# Patient Record
Sex: Male | Born: 1935 | Race: White | Hispanic: No | Smoking: Former smoker
Health system: Southern US, Community
[De-identification: ages and names within clinical notes are randomized; demographics above are authoritative.]

## PROBLEM LIST (undated history)

## (undated) DIAGNOSIS — R079 Chest pain, unspecified: Secondary | ICD-10-CM

## (undated) DIAGNOSIS — Z9889 Other specified postprocedural states: Secondary | ICD-10-CM

## (undated) DIAGNOSIS — I714 Abdominal aortic aneurysm, without rupture: Secondary | ICD-10-CM

## (undated) DIAGNOSIS — I491 Atrial premature depolarization: Secondary | ICD-10-CM

## (undated) DIAGNOSIS — K760 Fatty (change of) liver, not elsewhere classified: Secondary | ICD-10-CM

## (undated) DIAGNOSIS — I493 Ventricular premature depolarization: Secondary | ICD-10-CM

## (undated) DIAGNOSIS — I6529 Occlusion and stenosis of unspecified carotid artery: Secondary | ICD-10-CM

## (undated) DIAGNOSIS — Z87898 Personal history of other specified conditions: Secondary | ICD-10-CM

## (undated) DIAGNOSIS — E785 Hyperlipidemia, unspecified: Secondary | ICD-10-CM

## (undated) DIAGNOSIS — Z8673 Personal history of transient ischemic attack (TIA), and cerebral infarction without residual deficits: Secondary | ICD-10-CM

## (undated) DIAGNOSIS — C61 Malignant neoplasm of prostate: Secondary | ICD-10-CM

## (undated) DIAGNOSIS — I739 Peripheral vascular disease, unspecified: Secondary | ICD-10-CM

## (undated) DIAGNOSIS — I10 Essential (primary) hypertension: Secondary | ICD-10-CM

## (undated) DIAGNOSIS — K219 Gastro-esophageal reflux disease without esophagitis: Secondary | ICD-10-CM

## (undated) DIAGNOSIS — Z9289 Personal history of other medical treatment: Secondary | ICD-10-CM

## (undated) HISTORY — DX: Occlusion and stenosis of unspecified carotid artery: I65.29

## (undated) HISTORY — DX: Malignant neoplasm of prostate: C61

## (undated) HISTORY — PX: APPENDECTOMY: SHX54

## (undated) HISTORY — DX: Personal history of other specified conditions: Z87.898

## (undated) HISTORY — DX: Essential (primary) hypertension: I10

## (undated) HISTORY — PX: TONSILLECTOMY: SHX5217

## (undated) HISTORY — DX: Abdominal aortic aneurysm, without rupture: I71.4

## (undated) HISTORY — DX: Gastro-esophageal reflux disease without esophagitis: K21.9

## (undated) HISTORY — PX: HERNIA REPAIR: SHX51

## (undated) HISTORY — DX: Personal history of transient ischemic attack (TIA), and cerebral infarction without residual deficits: Z86.73

## (undated) HISTORY — PX: TOTAL KNEE ARTHROPLASTY: SHX125

## (undated) HISTORY — PX: PROSTATE SURGERY: SHX751

## (undated) HISTORY — DX: Hyperlipidemia, unspecified: E78.5

## (undated) HISTORY — DX: Ventricular premature depolarization: I49.3

## (undated) HISTORY — DX: Atrial premature depolarization: I49.1

## (undated) HISTORY — DX: Other specified postprocedural states: Z98.890

---

## 1996-04-13 HISTORY — PX: CAROTID ENDARTERECTOMY: SUR193

## 1997-12-18 ENCOUNTER — Ambulatory Visit (HOSPITAL_COMMUNITY): Admission: RE | Admit: 1997-12-18 | Discharge: 1997-12-18 | Payer: Self-pay | Admitting: Gastroenterology

## 1999-05-21 ENCOUNTER — Encounter: Payer: Self-pay | Admitting: General Surgery

## 1999-05-23 ENCOUNTER — Ambulatory Visit (HOSPITAL_COMMUNITY): Admission: RE | Admit: 1999-05-23 | Discharge: 1999-05-24 | Payer: Self-pay | Admitting: General Surgery

## 1999-05-23 ENCOUNTER — Encounter (INDEPENDENT_AMBULATORY_CARE_PROVIDER_SITE_OTHER): Payer: Self-pay | Admitting: Specialist

## 1999-08-04 ENCOUNTER — Ambulatory Visit (HOSPITAL_COMMUNITY): Admission: RE | Admit: 1999-08-04 | Discharge: 1999-08-04 | Payer: Self-pay | Admitting: Gastroenterology

## 2000-02-04 ENCOUNTER — Encounter: Payer: Self-pay | Admitting: Gastroenterology

## 2000-02-04 ENCOUNTER — Encounter: Admission: RE | Admit: 2000-02-04 | Discharge: 2000-02-04 | Payer: Self-pay | Admitting: Gastroenterology

## 2002-02-10 ENCOUNTER — Encounter: Payer: Self-pay | Admitting: Family Medicine

## 2002-02-10 ENCOUNTER — Ambulatory Visit (HOSPITAL_COMMUNITY): Admission: RE | Admit: 2002-02-10 | Discharge: 2002-02-10 | Payer: Self-pay | Admitting: *Deleted

## 2002-02-15 ENCOUNTER — Encounter: Admission: RE | Admit: 2002-02-15 | Discharge: 2002-02-15 | Payer: Self-pay | Admitting: Vascular Surgery

## 2002-02-15 ENCOUNTER — Encounter: Payer: Self-pay | Admitting: Vascular Surgery

## 2003-01-10 ENCOUNTER — Encounter: Payer: Self-pay | Admitting: Gastroenterology

## 2003-01-10 ENCOUNTER — Ambulatory Visit (HOSPITAL_COMMUNITY): Admission: RE | Admit: 2003-01-10 | Discharge: 2003-01-10 | Payer: Self-pay | Admitting: Gastroenterology

## 2003-05-25 ENCOUNTER — Encounter: Admission: RE | Admit: 2003-05-25 | Discharge: 2003-05-25 | Payer: Self-pay | Admitting: Orthopedic Surgery

## 2003-06-08 ENCOUNTER — Encounter: Admission: RE | Admit: 2003-06-08 | Discharge: 2003-06-08 | Payer: Self-pay | Admitting: Orthopedic Surgery

## 2003-06-11 ENCOUNTER — Ambulatory Visit (HOSPITAL_BASED_OUTPATIENT_CLINIC_OR_DEPARTMENT_OTHER): Admission: RE | Admit: 2003-06-11 | Discharge: 2003-06-11 | Payer: Self-pay | Admitting: Orthopedic Surgery

## 2003-06-11 ENCOUNTER — Ambulatory Visit (HOSPITAL_COMMUNITY): Admission: RE | Admit: 2003-06-11 | Discharge: 2003-06-11 | Payer: Self-pay | Admitting: Orthopedic Surgery

## 2004-10-21 ENCOUNTER — Ambulatory Visit (HOSPITAL_COMMUNITY): Admission: RE | Admit: 2004-10-21 | Discharge: 2004-10-21 | Payer: Self-pay | Admitting: Gastroenterology

## 2007-01-05 ENCOUNTER — Ambulatory Visit (HOSPITAL_COMMUNITY): Admission: RE | Admit: 2007-01-05 | Discharge: 2007-01-05 | Payer: Self-pay | Admitting: Vascular Surgery

## 2007-02-04 ENCOUNTER — Ambulatory Visit: Payer: Self-pay | Admitting: Vascular Surgery

## 2008-02-08 ENCOUNTER — Ambulatory Visit (HOSPITAL_COMMUNITY): Admission: RE | Admit: 2008-02-08 | Discharge: 2008-02-08 | Payer: Self-pay | Admitting: Gastroenterology

## 2008-03-01 ENCOUNTER — Encounter: Admission: RE | Admit: 2008-03-01 | Discharge: 2008-03-01 | Payer: Self-pay | Admitting: Gastroenterology

## 2008-03-16 ENCOUNTER — Ambulatory Visit: Payer: Self-pay | Admitting: Vascular Surgery

## 2008-07-11 ENCOUNTER — Ambulatory Visit (HOSPITAL_COMMUNITY): Admission: RE | Admit: 2008-07-11 | Discharge: 2008-07-11 | Payer: Self-pay | Admitting: Orthopedic Surgery

## 2009-03-04 ENCOUNTER — Ambulatory Visit: Payer: Self-pay | Admitting: Vascular Surgery

## 2009-06-19 ENCOUNTER — Ambulatory Visit (HOSPITAL_COMMUNITY): Admission: RE | Admit: 2009-06-19 | Discharge: 2009-06-19 | Payer: Self-pay | Admitting: Orthopedic Surgery

## 2009-07-09 ENCOUNTER — Inpatient Hospital Stay (HOSPITAL_COMMUNITY): Admission: RE | Admit: 2009-07-09 | Discharge: 2009-07-13 | Payer: Self-pay | Admitting: Orthopedic Surgery

## 2010-01-13 ENCOUNTER — Ambulatory Visit: Payer: Self-pay | Admitting: Cardiology

## 2010-03-18 ENCOUNTER — Ambulatory Visit: Payer: Self-pay | Admitting: Vascular Surgery

## 2010-07-02 LAB — HEMOGLOBIN AND HEMATOCRIT, BLOOD: Hemoglobin: 10.8 g/dL — ABNORMAL LOW (ref 13.0–17.0)

## 2010-07-02 LAB — PROTIME-INR: Prothrombin Time: 22.2 seconds — ABNORMAL HIGH (ref 11.6–15.2)

## 2010-07-07 LAB — DIFFERENTIAL
Basophils Absolute: 0 10*3/uL (ref 0.0–0.1)
Basophils Relative: 0 % (ref 0–1)
Eosinophils Absolute: 0.1 10*3/uL (ref 0.0–0.7)
Eosinophils Relative: 1 % (ref 0–5)

## 2010-07-07 LAB — COMPREHENSIVE METABOLIC PANEL
ALT: 21 U/L (ref 0–53)
AST: 25 U/L (ref 0–37)
CO2: 31 mEq/L (ref 19–32)
Chloride: 102 mEq/L (ref 96–112)
Creatinine, Ser: 0.89 mg/dL (ref 0.4–1.5)
GFR calc Af Amer: >60 mL/min (ref >60)
GFR calc non Af Amer: >60 mL/min (ref >60)
Total Bilirubin: 0.7 mg/dL (ref 0.3–1.2)

## 2010-07-07 LAB — PROTIME-INR
Prothrombin Time: 13.2 seconds (ref 11.6–15.2)
Prothrombin Time: 14.2 seconds (ref 11.6–15.2)

## 2010-07-07 LAB — CBC
MCV: 90.7 fL (ref 78.0–100.0)
RBC: 4.84 MIL/uL (ref 4.22–5.81)
WBC: 9 10*3/uL (ref 4.0–10.5)

## 2010-07-07 LAB — URINALYSIS, ROUTINE W REFLEX MICROSCOPIC
Bilirubin Urine: NEGATIVE
Glucose, UA: NEGATIVE mg/dL
Hgb urine dipstick: NEGATIVE
Hgb urine dipstick: NEGATIVE
Protein, ur: NEGATIVE mg/dL
Specific Gravity, Urine: 1.021 (ref 1.005–1.030)
Urobilinogen, UA: 0.2 mg/dL (ref 0.0–1.0)
Urobilinogen, UA: 0.2 mg/dL (ref 0.0–1.0)
pH: 6.5 (ref 5.0–8.0)

## 2010-07-07 LAB — HEMOGLOBIN AND HEMATOCRIT, BLOOD
HCT: 33.3 % — ABNORMAL LOW (ref 39.0–52.0)
HCT: 33.8 % — ABNORMAL LOW (ref 39.0–52.0)
HCT: 43.8 % (ref 39.0–52.0)
Hemoglobin: 14.8 g/dL (ref 13.0–17.0)

## 2010-07-24 LAB — COMPREHENSIVE METABOLIC PANEL
AST: 25 U/L (ref 0–37)
Alkaline Phosphatase: 99 U/L (ref 39–117)
CO2: 30 mEq/L (ref 19–32)
Chloride: 104 mEq/L (ref 96–112)
Creatinine, Ser: 0.91 mg/dL (ref 0.4–1.5)
GFR calc Af Amer: >60 mL/min (ref >60)
GFR calc non Af Amer: >60 mL/min (ref >60)
Potassium: 4.4 mEq/L (ref 3.5–5.1)
Total Bilirubin: 0.7 mg/dL (ref 0.3–1.2)

## 2010-07-24 LAB — CBC
HCT: 46 % (ref 39.0–52.0)
MCV: 90.3 fL (ref 78.0–100.0)
RBC: 5.09 MIL/uL (ref 4.22–5.81)
WBC: 7.9 10*3/uL (ref 4.0–10.5)

## 2010-07-31 ENCOUNTER — Other Ambulatory Visit: Payer: Self-pay | Admitting: *Deleted

## 2010-07-31 DIAGNOSIS — I10 Essential (primary) hypertension: Secondary | ICD-10-CM

## 2010-07-31 MED ORDER — METOPROLOL SUCCINATE ER 50 MG PO TB24: 50.0000 mg | ORAL_TABLET | Freq: Every day | ORAL | Status: DC

## 2010-07-31 NOTE — Telephone Encounter (Signed)
escribe medication per fax request  

## 2010-08-26 NOTE — Assessment & Plan Note (Signed)
OFFICE VISIT   Eric Carpenter, Eric Carpenter  DOB:  [birth year]                                       02/04/2007  ZOXWR#:[id number]   The patient presents today for continued followup of his known abdominal  aortic aneurysm.  I had seen him 1 year ago in 02/2006 regarding this  and he had been followed with ultrasound with no change.  He had gotten  a recent CAT scan for evaluation of abdominal pain with his known  aneurysm on 01/05/2007 and I have reviewed this with him as well.  He  continues to do well overall.  He does have irregular heartbeat and is  currently on a cardiac monitor for further evaluation of this.  He does  have history of hypertension, elevated cholesterol.  He has no history  of heart attack, congestive heart failure or COPD.   SOCIAL HISTORY:  Unchanged.  He is single, retired.  Quit smoking in  1985.  Does have several ounces of alcohol several times per week.   REVIEW OF SYSTEMS:  CONSTITUTIONAL:  Positive for weight gain.  CARDIAC:  Significant for palpitations and heart murmur.  GI:  Significant for hiatal hernia.  VASCULAR:  Status post mini-stroke in the past.  NEUROLOGIC:  Dizziness.  He has no GU, orthopedic, psychiatric, ENT or blood disorders.   ALLERGIES:  He has no known drug allergies.   PHYSICAL EXAMINATION:  General:  Well-developed, well-nourished white  male appearing younger than stated age of 23.  Vital Signs:  Blood  pressure is 153/83, pulse 68, respirations 16.  His oxygen saturations  are 98% on room air.  Vascular:  His carotid arteries are without  bruits.  He does have a well-healed right carotid incision.  Radial  pulses are 2+.  His femoral, popliteal and dorsalis pedis pulses are 2+  bilaterally.  Abdomen:  Exam reveals easily palpable, nontender  abdominal aortic aneurysm.   His CAT scan reviewed with him shows a maximal diameter of his  infrarenal aorta of 4 cm.  This is up slightly over time, with his prior  CT 5 years ago of 3.5 cm.  I have reassured the patient regarding this.  I explained that he has had a very slow growth of his known aneurysm  over time.  We would recommend continued observation.  I plan to see him  again in 1 year with repeat ultrasound at that time.  He understands  symptoms of aortic aneurysm dissection.   Larina Earthly, M.D.  Electronically Signed   TFE/MEDQ  D:  02/04/2007  T:  02/07/2007  Job:  [id number]   cc:   Dr. Desmond Dike  Peter M. [place], M.D.

## 2010-08-26 NOTE — Assessment & Plan Note (Signed)
OFFICE VISIT   Eric Carpenter, Eric Carpenter  DOB:  October 27, 1926                                       03/16/2008  OZHYQ#:[id number]   The patient presents today for continued follow-up of his known  abdominal aortic aneurysm.  He continues to be in excellent health at  his age of 37.  He denies any new difficulty, he denies any new cardiac  difficulty.  He does continue to be treated for elevated blood pressure  and elevated cholesterol, he has not smoked since 1985.   PHYSICAL EXAM:  A well-developed, well-nourished white male appearing  younger than stated age of 7, blood pressure is 173/101, pulse 68,  respirations 18.  He reports that he just discovered he had misplaced  his social security card and was agitated and reports that his blood  pressure does not typically run in this level.  He does have palpable  radial pulses and palpable femoral pulses, his aneurysm is palpable and  nontender.   He underwent repeat ultrasound today and this reveals no significant  change since his last visit 1 year ago.  He did have a maximal diameter  of 3.9, up from 3.7.  I have reassured him of this and recommended that  we see him on a yearly basis.  Plan to see him again in 1 year with  ultrasound.  He understands symptoms of leaking aneurysm and will notify  us immediately should this occur.   Larina Earthly, M.D.  Electronically Signed   TFE/MEDQ  D:  03/16/2008  T:  03/19/2008  Job:  [id number]

## 2010-08-26 NOTE — Procedures (Signed)
DUPLEX ULTRASOUND OF ABDOMINAL AORTA   INDICATION:  Followup abdominal aortic aneurysm   HISTORY:  Diabetes:  no  Cardiac:  no  Hypertension:  yes  Smoking:  Previous  Connective Tissue Disorder:  Family History:  no  Previous Surgery:  No   DUPLEX EXAM:         AP (cm)                   TRANSVERSE (cm)  Proximal             2.4 cm                    2.5 cm  Mid                  2.9 cm                    3.2 cm  Distal               3.2 cm                    3.9 cm  Right Iliac          1.8 cm                    2.1 cm  Left Iliac           1.8 cm                    1.8 cm   PREVIOUS:  Date:  AP:  3.6  TRANSVERSE:  3.3   IMPRESSION:  Stable abdominal aortic aneurysm within the mid distal  aorta with intraluminal thrombus.  The largest measurement today was 3.9  x 3.2 cm. Bilateral ectatic iliac arteries with atherosclerosis.   ___________________________________________  Larina Earthly, M.D.   OD/MEDQ  D:  03/19/2010  T:  03/19/2010  Job:  [id number]

## 2010-08-26 NOTE — Procedures (Signed)
DUPLEX ULTRASOUND OF ABDOMINAL AORTA   INDICATION:  Follow up for known right abdominal aortic aneurysm.   HISTORY:  Diabetes:  No.  Cardiac:  No.  Hypertension:  Yes.  Smoking:  Quit 3 years ago.  Connective Tissue Disorder:  Family History:  Previous Surgery:  Right carotid endarterectomy on 02/22/1997.   DUPLEX EXAM:         AP (cm)                   TRANSVERSE (cm)  Proximal             2.3 cm                    2.4 cm  Mid                  3.9 cm                    3.3 cm  Distal               1.8 cm                    1.7 cm  Right Iliac          1.3 cm                    1.3 cm  Left Iliac           1.1 cm                    1.2 cm   PREVIOUS:  Date: 02/15/2003  AP:  3.7  TRANSVERSE:  3.7   IMPRESSION:  Abnormal aortic aneurysm is slightly larger than previously  recorded.   ___________________________________________  Larina Earthly, M.D.   AC/MEDQ  D:  03/16/2008  T:  03/16/2008  Job:  [id number]

## 2010-08-26 NOTE — Procedures (Signed)
DUPLEX ULTRASOUND OF ABDOMINAL AORTA   INDICATION:  Follow up of abdominal aortic aneurysm.   HISTORY:  Diabetes:  No.  Cardiac:  No.  Hypertension:  Yes.  Smoking:  Previous.  Family History:  No.  Previous Surgery:  Right carotid endarterectomy in November, 1998.   DUPLEX EXAM:         AP (cm)                   TRANSVERSE (cm)  Proximal             3.4 cm                    3.6 cm  Mid                  3.6 cm                    3.3 cm  Distal               1.9 cm                    1.6 cm  Right Iliac          1.3 cm                    1.8 cm  Left Iliac           0.85 cm                   0.9 cm   PREVIOUS:  Date: 03/16/2008  AP:  3.9  TRANSVERSE:  3.3   IMPRESSION:  1. Abdominal aortic aneurysm with largest measurement of 3.6 X 3.3 cm.  2. Abdominal aortic aneurysm remains stable with previous study.   ___________________________________________  Larina Earthly, M.D.   CJ/MEDQ  D:  03/04/2009  T:  03/04/2009  Job:  [id number]

## 2010-08-26 NOTE — Op Note (Signed)
NAME:  Eric Carpenter, Eric Carpenter NO.:  [id number]   MEDICAL RECORD NO.:  [id number]          PATIENT TYPE:  AMB   LOCATION:  DAY                          FACILITY:  [facility]   PHYSICIAN:  Georges Lynch. Gioffre, M.D.DATE OF BIRTH:  Dec 09, 1926   DATE OF PROCEDURE:  07/11/2008  DATE OF DISCHARGE:                               OPERATIVE REPORT   SURGEON:  Georges Lynch. Darrelyn Hillock, M.D.   ASSISTANT:  Nurse.   PREOPERATIVE DIAGNOSES:  1. Degenerative arthritis, right knee.  2. Torn medial meniscus, right knee.   POSTOPERATIVE DIAGNOSES:  1. Degenerative arthritis, right knee.  2. Torn medial meniscus, right knee.   OPERATION:  1. Diagnostic arthroscopy, right knee.  2. Medial meniscectomy, right knee.  3. Synovectomy of the suprapatellar pouch, right knee  4. Abrasion chondroplasty of the medial femoral condyle, right knee.  5. Abrasion chondroplasty of the lateral femoral condyle, right knee.   PROCEDURE:  Under general anesthesia, routine orthopedic prep and drape  of the right lower extremity carried out.  He had 2 grams of IV Ancef.  A small punctate incision was made in the suprapatellar pouch.  The  inflow cannula was inserted and the knee was distended with saline.  Another small punctate incision was made in the anterolateral joint.  The arthroscope was entered from the lateral approach and a complete  diagnostic arthroscopy was carried out.  At this time, I made a small  punctate incision in the medial joint space and the shaver suction  device was entered from the medial approach.  I did a partial medial  meniscectomy of the posterior horn.  I then went on with the ArthroCare  and completed it, the meniscectomy.  He had a significant amount of  arthritis of the medial joint space.  I had to do an abrasion  chondroplasty of the medial femoral condyle.  He also has some wear of  his tibial plateau.  The cruciates were intact.  I went over to the  lateral joint.  I had to  definitely do a abrasion chondroplasty of the  lateral femoral condyle as well.  The lateral meniscus was intact.  The  remaining part of the medial meniscus was intact.  I then went up into  the suprapatellar pouch.  He had severe chronic synovitis.  I introduced  the ArthroCare and did a synovectomy.  I thoroughly irrigated out the  knee and removed the fluid.  I closed all three punctate incisions with  3-0 nylon suture.  I injected 30 mL of 0.5% Marcaine with epinephrine  into the knee joint and a sterile Neosporin bundle dressing was applied.   Postop:  1. He will be on walker full weightbearing.  2. See me in the office in 10-12 days for suture removal.  3. Aspirin 325 mg b.i.d. today the day of surgery and b.i.d. for 2      weeks.  4. To be on Percocet 5/325 one every 4 hours p.r.n. for pain.           ______________________________  Georges Lynch. Darrelyn Hillock, M.D.     RAG/MEDQ  D:  07/11/2008  T:  07/11/2008  Job:  [id number]

## 2010-08-29 NOTE — Op Note (Signed)
[place]. [facility]  Patient:    Eric Carpenter, Eric Carpenter                      MRN: [id number] Proc. Date: 05/23/99 Adm. Date:  [id number] Attending:  Carson Myrtle                           Operative Report  PREOPERATIVE DIAGNOSIS:  Right inguinal hernia.  POSTOPERATIVE DIAGNOSIS:  Right direct and indirect inguinal hernia.  OPERATIVE PROCEDURE:  Repair of hernia with mesh.  SURGEON:  Timothy E. Earlene Plater, M.D.  ANESTHESIA:  CRNA standby, IV sedation.  INDICATIONS:  [name] is a fairly healthy gentleman for 82.  He has a new, painful, uncomfortable and enlarging right inguinal hernia that he wishes to have repaired at this time.   He does have some complications in the postoperative previous course of prostate surgery, choledocholithiasis.  He had a radical prostatectomy in May 2000 for carcinoma.  He is well now and has no complaints r problems at this time and has had no further GI symptoms.  He wishes to proceed  with surgery and has carefully considered this.  PROCEDURE:  The patient was brought in the operating room, placed supine position, IV started, sedation given.  The right groin had been shaved, it was prepped and draped in the usual fashion.  Then, 0.5% Marcaine with epinephrine was used throughout for ______  block anesthesia.  Once this was accomplished, a curvilinear incision made over the palpable defect.  The subcutaneous tissue dissected, bleeding points carefully cauterized.  External oblique opened in line with the fibers of the external ring, the cord structures were carefully dissected from the floor of the canal.  The floor was somewhat weakened as evidenced with a direct hernia and a moderate indirect hernia sac was dissected from the cord structures very carefully, ligated at its neck, excess sac cut away and the neck retracted through the internal ring.  A piece of mesh was then cut and fashioned to fit the floor  of the canal to wrap up to and around the internal ring.  This was accomplished, sewn in place with a running 2-0 Prolene.  With this, the procedure was complete.  The cord was allowed to lay in its anatomic position.  There was no bleeding or complication.  The external oblique was closed with a running 3-0 Vicryl, deep subcu 3-0 Vicryl and skin 3-0 Monocryl.  Steri-Strips and dry sterile dressing applied, counts were correct.  He tolerated it well, was removed to recovery room in good condition.  Since he lives alone and has these other problems, we are admitting him overnight for observation. DD:  05/23/99 TD:  05/24/99 Job: [id number]

## 2010-08-29 NOTE — Op Note (Signed)
NAME:  Eric Carpenter, Eric Carpenter                       ACCOUNT NO.:  [id number]   MEDICAL RECORD NO.:  [id number]                   PATIENT TYPE:  AMB   LOCATION:  DSC                                  FACILITY:  [facility]   PHYSICIAN:  Georges Lynch. Darrelyn Hillock, M.D.             DATE OF BIRTH:  August 08, 1926   DATE OF PROCEDURE:  06/11/2003  DATE OF DISCHARGE:                                 OPERATIVE REPORT   SURGEON:  Georges Lynch. Darrelyn Hillock, M.D.   ASSISTANT:  Nurse.   PREOPERATIVE DIAGNOSES:  1. Torn lateral meniscus, left knee.  2. Torn medial meniscus, left knee.  3. Chronic synovitis, left knee.  4. Degenerative arthritis, left knee.   POSTOPERATIVE DIAGNOSES:  1. Torn lateral meniscus, left knee.  2. Torn medial meniscus, left knee.  3. Chronic synovitis, left knee.  4. Degenerative arthritis, left knee.   OPERATION:  1. Diagnostic arthroscopy of left knee.  2. Partial lateral meniscectomy of left knee.  3. Partial medial meniscectomy of left knee.  4. Synovectomy of suprapatellar pouch of left knee.  5. Abrasion chondroplasty of the lateral femoral condyle of left knee.   ANESTHESIA:  General anesthesia.   PROCEDURE:  Under general anesthesia, routine orthopedic prep and draping  were carried out of the left lower extremity.  At this time, a small  punctate incision was made in the suprapatellar pouch, inflow cannula was  inserted and knee was distended with saline.  At this time, another small  punctate incision was made in the lateral joint space of the left knee,  arthroscope was entered and a complete diagnostic arthroscopy was carried  out.  I then entered the shaver suction device through the medial approach.  I did a synovectomy of the suprapatellar pouch and an abrasion chondroplasty  of the patella.  I went down to the lateral joint space; he had a complex  tear of the lateral meniscus.  I simply shaved that out with a shaver  suction device.  I then noted the cruciates were  intact.  I went down and  checked the medial side of his joint and did a partial medial meniscectomy  of the posterior horn, which had a complex tear.  Note, I went back over to  the lateral side of the joint, did an abrasion chondroplasty of the lateral  femoral condyle; he had some significant arthritic changes there as well.  I  thoroughly irrigated out the area, removed all the fluids, closed all 3  punctate incisions with 3-0 nylon suture.  I then injected 20 mL of 0.5%  Marcaine and epinephrine in the knee joint.  Prior to this, I injected each  portal with 1% plain Xylocaine.  Sterile Neosporin and bundle dressings were  applied.  The patient had 1 g of IV Ancef preop.  Sterile dressings, as I  mentioned, were applied.  The patient left the operating room in  satisfactory condition.  Ronald A. Darrelyn Hillock, M.D.    RAG/MEDQ  D:  06/11/2003  T:  06/11/2003  Job:  [id number]

## 2010-08-29 NOTE — Procedures (Signed)
[place]. [facility]  Patient:    Eric Carpenter, Eric Carpenter                      MRN: [id number] Proc. Date: 08/04/99 Adm. Date:  [id number] Attending:  Rich Brave CC:         Dr. Desmond Dike in [place], [place]                           Procedure Report  PROCEDURE:  Upper endoscopy with biopsies.  ENDOSCOPIST:  Florencia Reasons, M.D.  ANESTHESIA:  INDICATIONS:  Diarrhea and loose stools in a 77 year old, unresponsive to trials of medical therapy.  FINDINGS:  Small hiatal hernia, otherwise normal exam.  DESCRIPTION OF PROCEDURE:  The nature, purpose, and risks of the procedure had een discussed with the patient who provided written consent.  Sedation for this procedure and the colonoscopy which followed it totalled fentanyl 100 mcg and Versed 9 mg IV without arrhythmias or desaturation.  The Olympus GIF-140 video upper endoscope was passed under direct vision.  The vocal cords had a similar appearance to a year-and-a-half ago when he had previously been endoscoped, with mild atrophy of the left cord.  The esophagus was entered without undue difficulty and had normal mucosa, without evidence of reflux esophagitis, varices, infection, or neoplasia.  No ring or stricture was appreciated, although a 2 cm hiatal hernia was present.  The stomach was entered.  It contained no significant residual and had completely normal mucosa without evidence of gastritis, erosions, ulcers, polyps, or masses.  A retroflexed view of the proximal stomach showed just a very slightly patchulous diaphragmatic hiatus ________ the hiatal hernia.  The pylorus, duodenal bulb, and second and third portions of the duodenum looked normal.  Normal mucosa.  Biopsies were obtained along the length of the duodenum to help rule out celiac disease or Whipple disease, prior to removal of the scope.  The patient tolerated the procedure well and there were no  apparent complications.  IMPRESSION:  Essentially normal upper endoscopy.  PLAN:  Await pathology on todays biopsies.  Proceed to colonoscopic evaluation. DD:  08/04/99 TD:  08/04/99 Job: [id number]

## 2010-08-29 NOTE — Procedures (Signed)
[place]. [facility]  Patient:    Eric Carpenter, Eric Carpenter                      MRN: [id number] Proc. Date: 08/04/99 Adm. Date:  [id number] Attending:  Rich Brave CC:         Dr. Desmond Dike, [place]                           Procedure Report  PROCEDURE PERFORMED:  Colonoscopy with polypectomy.  ENDOSCOPIST:  Florencia Reasons, M.D.  INDICATIONS FOR PROCEDURE:  Diarrhea, sometime to the point of fecal incontinence.  FINDINGS:  Normal exam to the cecum.  DESCRIPTION OF PROCEDURE:  The nature, purpose and risks of the procedure had been discussed with the patient, who provided written consent.  Sedation for this procedure and the upper endoscopy which preceeded it totalled fentanyl 100 mcg and Versed 9 mg IV without arrhythmias or desaturation.  The Olympus adult video colonoscope was advanced quite easily to the cecum as identified by visualization of the appendiceal orifice following a grossly unremarkable digital rectal exam despite the patients history of prostate cancer and associated surgery.  Pullback was then performed.  The quality of the prep was excellent and it was felt that all areas were well seen.  Brief attempts to get into the terminal ileum were unsuccessful.  This was a normal examination.  There was no obvious source of the patients diarrhea and in particular no evidence of colitis.  However, I did obtain random mucosal biopsies along the length of the colon to help exclude microscopic colitis.  There was a 3 x 5 mm sessile polyp removed by snare technique from the region of the hepatic flexure, with the polyp being retrieved by suction through the scope.  No other polyps were seen and there was no evidence of colon cancer. No diverticulosis was appreciated.  Retroflexion in the rectum was unremarkable.  The patient tolerated the procedure well and there were no apparent complications.  IMPRESSION:  Small colon polyp,  otherwise normal colonoscopy without source of diarrhea evident.  PLAN:  Await pathology on todays polyp as well as on the random mucosal biopsies which I obtained along the length in view of the patients history of diarrhea. DD:  08/04/99 TD:  08/04/99 Job: [id number]

## 2010-10-12 HISTORY — PX: EYE SURGERY: SHX253

## 2010-11-10 ENCOUNTER — Encounter: Payer: Self-pay | Admitting: *Deleted

## 2010-11-11 ENCOUNTER — Encounter: Payer: Self-pay | Admitting: Cardiology

## 2010-11-11 ENCOUNTER — Ambulatory Visit (INDEPENDENT_AMBULATORY_CARE_PROVIDER_SITE_OTHER): Payer: Medicare Other | Admitting: Cardiology

## 2010-11-11 DIAGNOSIS — Z9889 Other specified postprocedural states: Secondary | ICD-10-CM

## 2010-11-11 DIAGNOSIS — I714 Abdominal aortic aneurysm, without rupture: Secondary | ICD-10-CM | POA: Insufficient documentation

## 2010-11-11 DIAGNOSIS — I10 Essential (primary) hypertension: Secondary | ICD-10-CM

## 2010-11-11 DIAGNOSIS — E785 Hyperlipidemia, unspecified: Secondary | ICD-10-CM

## 2010-11-11 DIAGNOSIS — I491 Atrial premature depolarization: Secondary | ICD-10-CM | POA: Insufficient documentation

## 2010-11-11 DIAGNOSIS — I4949 Other premature depolarization: Secondary | ICD-10-CM

## 2010-11-11 DIAGNOSIS — I493 Ventricular premature depolarization: Secondary | ICD-10-CM | POA: Insufficient documentation

## 2010-11-11 MED ORDER — LOSARTAN POTASSIUM 50 MG PO TABS: 50.0000 mg | ORAL_TABLET | Freq: Every day | ORAL | Status: DC

## 2010-11-11 NOTE — Assessment & Plan Note (Signed)
His most recent lipid panel in October of 2011 showed a total cholesterol of 182, restaurants 110, HDL 48, and LDL of 111. He will continue on Lipitor. He is doing a better job of watching his diet. He will continue with his exercise program.

## 2010-11-11 NOTE — Patient Instructions (Signed)
Stop benazepril.  We will start Losartan 50 mg daily.  Continue your other medications.  I will see you again in 6 months.

## 2010-11-11 NOTE — Progress Notes (Signed)
Eric Carpenter Date of Birth: November 20, 1926   History of Present Illness: Eric Carpenter is seen today for followup. He remains very active. He works out 3 days a week in rehabilitation therapy for his knee. He walks on the other days up to 3 miles. Occasionally he will feel a throbbing sensation in his face. He sometimes will get a little bit lightheaded for no apparent reason. He has had a persistent cough that is dry. He had followup with Dr. Arbie Cookey in December for his abdominal aneurysm and carotid disease. He has had no history of stroke. He denies any abdominal or back pain. He denies any chest pain or shortness of breath.  Current Outpatient Prescriptions on File Prior to Visit  Medication Sig Dispense Refill  . ALPRAZolam (XANAX) 0.25 MG tablet Take 0.25 mg by mouth as needed.        [name] amLODipine (NORVASC) 10 MG tablet Take 1 tablet by mouth Daily.      [name] aspirin 81 MG tablet Take 81 mg by mouth daily.        [name] atorvastatin (LIPITOR) 40 MG tablet Take 40 mg by mouth daily.        . benazepril (LOTENSIN) 20 MG tablet Take 1 tablet by mouth Daily.      . metoprolol (TOPROL XL) 50 MG 24 hr tablet Take 1 tablet (50 mg total) by mouth daily.  30 tablet  5  . multivitamin (THERAGRAN) per tablet Take 1 tablet by mouth daily.        . naproxen sodium (ANAPROX) 220 MG tablet Take 220 mg by mouth as needed.          Allergies  Allergen Reactions  . Ciprofloxacin     Past Medical History  Diagnosis Date  . Hypertension   . Hyperlipidemia   . PVC's (premature ventricular contractions)   . Premature atrial contraction   . GERD (gastroesophageal reflux disease)   . History of TIAs     Questionable CVA in 1989  . Abdominal aortic aneurysm   . Prostate cancer   . History of measles, mumps, or rubella   . Status post carotid endarterectomy     Past Surgical History  Procedure Date  . Appendectomy   . Tonsillectomy   . Hernia repair   . Prostate surgery   . Total knee arthroplasty      right  . Carotid endarterectomy     right    History  Smoking status  . Former Smoker  . Quit date: 04/13/1981  Smokeless tobacco  . Not on file    History  Alcohol Use No    Family History  Problem Relation Age of Onset  . Stroke Father   . Heart failure Mother     Review of Systems: As noted in history of present illness.  All other systems were reviewed and are negative.  Physical Exam: BP 130/92  Pulse 64  Ht 5' 9.5" (1.765 m)  Wt 176 lb 12.8 oz (80.196 kg)  BMI 25.73 kg/m2 He is a pleasant white male in no acute distress. He is normocephalic, atraumatic. Pupils are equal round and react to light and accommodation. Extraocular movements are full. Oropharynx clear. Neck is supple no JVD adenopathy thyromegaly or bruits. He has an old right carotid endarterectomy scar. Lungs are clear. Cardiac exam reveals a regular rate and rhythm without gallop, murmur, or click. Abdomen is soft and nontender without masses or bruits. Extremities are without edema. He has  good pedal pulses. He is alert and oriented x3. Cranial nerves II through XII are intact. LABORATORY DATA: ECG demonstrates normal sinus rhythm with a first-degree AV block. It is otherwise normal. Assessment / Plan:

## 2010-11-11 NOTE — Assessment & Plan Note (Signed)
Blood pressure is well controlled but I am concerned that his cough is related to his ACE inhibitor. We will stop his benazepril and switch him to losartan 50 mg daily.

## 2010-11-12 ENCOUNTER — Encounter: Payer: Self-pay | Admitting: Cardiology

## 2011-01-22 LAB — CREATININE, SERUM
Creatinine, Ser: 0.99
GFR calc Af Amer: >60
GFR calc non Af Amer: >60

## 2011-02-17 ENCOUNTER — Telehealth: Payer: Self-pay | Admitting: Cardiology

## 2011-02-17 NOTE — Telephone Encounter (Signed)
Chest xray faxed to [facility]: [facility] @ [phone]  02/17/11/km

## 2011-09-12 ENCOUNTER — Other Ambulatory Visit: Payer: Self-pay

## 2011-09-12 DIAGNOSIS — I10 Essential (primary) hypertension: Secondary | ICD-10-CM

## 2011-09-12 MED ORDER — METOPROLOL SUCCINATE ER 50 MG PO TB24: 50.0000 mg | ORAL_TABLET | Freq: Every day | ORAL | Status: DC

## 2011-09-16 ENCOUNTER — Telehealth: Payer: Self-pay | Admitting: Cardiology

## 2011-09-16 NOTE — Telephone Encounter (Signed)
Pt calling stating increase in toprol to 50mg  has not helped with PVC's and he is getting very nervous about this and altho we had given him appoint for 7/2, he feels he want's to be seen sooner and wants to see only dr Clydene Fake i would pass message along to dr Elvis Coil nurse, cheryl--pt agrees--

## 2011-09-16 NOTE — Telephone Encounter (Signed)
New problem:  Patient calling C/O heart skipping beat . Medication recently increase

## 2011-09-17 NOTE — Telephone Encounter (Signed)
Patient was called, stated he has been having heart skipping off and on for 3 months.States toprol use to help but dont seem to help any more.States he feels good no chest pain or sob.Appointment scheduled with Dr.Jordan 10/06/11.

## 2011-10-06 ENCOUNTER — Encounter: Payer: Self-pay | Admitting: Cardiology

## 2011-10-06 ENCOUNTER — Ambulatory Visit: Payer: Medicare Other | Admitting: Cardiology

## 2011-10-06 ENCOUNTER — Ambulatory Visit (INDEPENDENT_AMBULATORY_CARE_PROVIDER_SITE_OTHER): Payer: Medicare Other | Admitting: Cardiology

## 2011-10-06 VITALS — BP 128/70 | HR 62 | Ht 69.0 in | Wt 176.0 lb

## 2011-10-06 DIAGNOSIS — I491 Atrial premature depolarization: Secondary | ICD-10-CM

## 2011-10-06 DIAGNOSIS — I493 Ventricular premature depolarization: Secondary | ICD-10-CM

## 2011-10-06 DIAGNOSIS — R002 Palpitations: Secondary | ICD-10-CM

## 2011-10-06 DIAGNOSIS — E785 Hyperlipidemia, unspecified: Secondary | ICD-10-CM

## 2011-10-06 DIAGNOSIS — I10 Essential (primary) hypertension: Secondary | ICD-10-CM

## 2011-10-06 DIAGNOSIS — I4949 Other premature depolarization: Secondary | ICD-10-CM

## 2011-10-06 NOTE — Assessment & Plan Note (Signed)
He is on chronic Lipitor therapy. His fasting lab work is followed by his primary care.

## 2011-10-06 NOTE — Patient Instructions (Addendum)
Continue your current medication.  I will see you again in 6 months.   

## 2011-10-06 NOTE — Assessment & Plan Note (Signed)
I think his recent palpitations are related to his known PACs and PVCs. I recommend a reduction in his chocolate use. We will continue with the higher dose of Toprol. I'll followup again in 6 months.

## 2011-10-06 NOTE — Assessment & Plan Note (Signed)
Blood pressure control is excellent especially with the increased dose of metoprolol.

## 2011-10-06 NOTE — Progress Notes (Signed)
Eric Carpenter Date of Birth: 01/03/1927   History of Present Illness: Eric Carpenter is seen today for followup. He reports that he is feeling very well. He has noted some palpitations. He states that his heart speeds up and skips a little bit. He typically last less than 5 minutes. We increased his Toprol dose and his symptoms are much better. He does eat a moderate amount of chocolate. He tries to avoid caffeine. He denies any shortness of breath or chest pain. He is followed yearly by Eric Carpenter for carotid arterial disease and history of abdominal aneurysm. He does have a chronic history of PACs and PVCs.  Current Outpatient Prescriptions on File Prior to Visit  Medication Sig Dispense Refill  . ALPRAZolam (XANAX) 0.25 MG tablet Take 0.25 mg by mouth as needed.        [name] amLODipine (NORVASC) 10 MG tablet Take 1 tablet by mouth Daily.      [name] aspirin 81 MG tablet Take 81 mg by mouth daily.        [name] atorvastatin (LIPITOR) 40 MG tablet Take 40 mg by mouth daily.        [name] losartan (COZAAR) 50 MG tablet Take 1 tablet (50 mg total) by mouth daily.  30 tablet  11  . metoprolol succinate (TOPROL XL) 50 MG 24 hr tablet Take 1 tablet (50 mg total) by mouth daily.  30 tablet  0  . multivitamin (THERAGRAN) per tablet Take 1 tablet by mouth daily.        . naproxen sodium (ANAPROX) 220 MG tablet Take 220 mg by mouth as needed.          Allergies  Allergen Reactions  . Ciprofloxacin     Past Medical History  Diagnosis Date  . Hypertension   . Hyperlipidemia   . PVC's (premature ventricular contractions)   . Premature atrial contraction   . GERD (gastroesophageal reflux disease)   . History of TIAs     Questionable CVA in 1989  . Abdominal aortic aneurysm   . Prostate cancer   . History of measles, mumps, or rubella   . Status post carotid endarterectomy     Past Surgical History  Procedure Date  . Appendectomy   . Tonsillectomy   . Hernia repair   . Prostate surgery   . Total  knee arthroplasty     right  . Carotid endarterectomy     right    History  Smoking status  . Former Smoker  . Quit date: 04/13/1981  Smokeless tobacco  . Not on file    History  Alcohol Use No    Family History  Problem Relation Age of Onset  . Stroke Father   . Heart failure Mother     Review of Systems: As noted in history of present illness.  All other systems were reviewed and are negative.  Physical Exam: BP 128/70  Pulse 62  Ht 5\' 9"  (1.753 m)  Wt 176 lb (79.833 kg)  BMI 25.99 kg/m2 He is a pleasant white male in no acute distress. He is normocephalic, atraumatic. Pupils are equal round and react to light and accommodation. Extraocular movements are full. Oropharynx clear. Neck is supple no JVD adenopathy thyromegaly or bruits. He has an old right carotid endarterectomy scar. Lungs are clear. Cardiac exam reveals a regular rate and rhythm without gallop, murmur, or click. Abdomen is soft and nontender without masses or bruits. Extremities are without edema. He has good pedal  pulses. He is alert and oriented x3. Cranial nerves II through XII are intact. LABORATORY DATA: ECG demonstrates normal sinus rhythm with a first-degree AV block and occasional PACs. Assessment / Plan:

## 2011-10-26 ENCOUNTER — Other Ambulatory Visit: Payer: Self-pay | Admitting: *Deleted

## 2011-10-26 DIAGNOSIS — I10 Essential (primary) hypertension: Secondary | ICD-10-CM

## 2011-10-26 MED ORDER — METOPROLOL SUCCINATE ER 50 MG PO TB24: 50.0000 mg | ORAL_TABLET | Freq: Every day | ORAL | Status: DC

## 2011-11-23 DIAGNOSIS — C61 Malignant neoplasm of prostate: Secondary | ICD-10-CM | POA: Insufficient documentation

## 2011-11-30 ENCOUNTER — Other Ambulatory Visit: Payer: Self-pay | Admitting: Cardiology

## 2011-11-30 MED ORDER — LOSARTAN POTASSIUM 50 MG PO TABS: 50.0000 mg | ORAL_TABLET | Freq: Every day | ORAL | Status: DC

## 2012-02-16 ENCOUNTER — Encounter: Payer: Self-pay | Admitting: Vascular Surgery

## 2012-03-09 ENCOUNTER — Ambulatory Visit: Payer: PRIVATE HEALTH INSURANCE | Admitting: Nurse Practitioner

## 2012-03-15 ENCOUNTER — Ambulatory Visit (INDEPENDENT_AMBULATORY_CARE_PROVIDER_SITE_OTHER): Payer: Medicare Other | Admitting: Cardiology

## 2012-03-15 ENCOUNTER — Encounter: Payer: Self-pay | Admitting: Cardiology

## 2012-03-15 VITALS — BP 150/82 | HR 60 | Ht 69.5 in | Wt 173.4 lb

## 2012-03-15 DIAGNOSIS — I4949 Other premature depolarization: Secondary | ICD-10-CM

## 2012-03-15 DIAGNOSIS — I491 Atrial premature depolarization: Secondary | ICD-10-CM

## 2012-03-15 DIAGNOSIS — I493 Ventricular premature depolarization: Secondary | ICD-10-CM

## 2012-03-15 DIAGNOSIS — I10 Essential (primary) hypertension: Secondary | ICD-10-CM

## 2012-03-15 NOTE — Patient Instructions (Signed)
Continue your current medication  Try and get regular aerobic exercise  Avoid caffeine.  I will see you again in 6 months.

## 2012-03-15 NOTE — Progress Notes (Signed)
Eric Carpenter Date of Birth: Aug 10, 1926   History of Present Illness: [name] is seen today for followup. He reports that he has had more frequent palpitations. This usually occurs when he first gets up in the morning. It is less noticeable during the day. He has been under increased stress recently. He is avoiding caffeine. He denies any chest pain or shortness of breath. He has a known history of PACs and PVCs and has worn monitors on 2 occasions in the past. He is taking his Toprol at night.  Current Outpatient Prescriptions on File Prior to Visit  Medication Sig Dispense Refill  . ALPRAZolam (XANAX) 0.25 MG tablet Take 0.25 mg by mouth as needed.        [name] amLODipine (NORVASC) 10 MG tablet Take 1 tablet by mouth Daily.      [name] aspirin 81 MG tablet Take 81 mg by mouth daily.        [name] atorvastatin (LIPITOR) 40 MG tablet Take 40 mg by mouth daily.        [name] losartan (COZAAR) 50 MG tablet Take 1 tablet (50 mg total) by mouth daily.  30 tablet  2  . metoprolol succinate (TOPROL-XL) 50 MG 24 hr tablet TAKE 1 TABLET BY MOUTH ONCE DAILY  30 tablet  11  . multivitamin (THERAGRAN) per tablet Take 1 tablet by mouth daily.          Allergies  Allergen Reactions  . Ciprofloxacin     Past Medical History  Diagnosis Date  . Hypertension   . Hyperlipidemia   . PVC's (premature ventricular contractions)   . Premature atrial contraction   . GERD (gastroesophageal reflux disease)   . History of TIAs     Questionable CVA in 1989  . Abdominal aortic aneurysm   . Prostate cancer   . History of measles, mumps, or rubella   . Status post carotid endarterectomy     Past Surgical History  Procedure Date  . Appendectomy   . Tonsillectomy   . Hernia repair   . Prostate surgery   . Total knee arthroplasty     right  . Carotid endarterectomy     right    History  Smoking status  . Former Smoker  . Quit date: 04/13/1981  Smokeless tobacco  . Not on file    History  Alcohol  Use No    Family History  Problem Relation Age of Onset  . Stroke Father   . Heart failure Mother     Review of Systems: As noted in history of present illness.  All other systems were reviewed and are negative.  Physical Exam: BP 150/82  Pulse 60  Ht 5' 9.5" (1.765 m)  Wt 173 lb 6.4 oz (78.654 kg)  BMI 25.24 kg/m2 He is a pleasant white male in no acute distress. He is normocephalic, atraumatic. Pupils are equal round and react to light and accommodation. Extraocular movements are full. Oropharynx clear. Neck is supple no JVD adenopathy thyromegaly or bruits. He has an old right carotid endarterectomy scar. Lungs are clear. Cardiac exam reveals a regular rate and rhythm without gallop, murmur, or click. Abdomen is soft and nontender without masses or bruits. Extremities are without edema. He has good pedal pulses. He is alert and oriented x3. Cranial nerves II through XII are intact. LABORATORY DATA: ECG demonstrates normal sinus rhythm with a first-degree AV block. It is otherwise normal. Assessment / Plan: 1. Palpitations related to chronic PACs and  PVCs. He is on appropriate dose of metoprolol now. I think his recent increase in symptoms are related to stress. We will continue his current therapy.  2. Hypertension  3. Hyperlipidemia.

## 2012-03-21 ENCOUNTER — Other Ambulatory Visit: Payer: Self-pay | Admitting: *Deleted

## 2012-03-21 DIAGNOSIS — I6529 Occlusion and stenosis of unspecified carotid artery: Secondary | ICD-10-CM

## 2012-03-21 DIAGNOSIS — I714 Abdominal aortic aneurysm, without rupture: Secondary | ICD-10-CM

## 2012-03-21 DIAGNOSIS — Z48812 Encounter for surgical aftercare following surgery on the circulatory system: Secondary | ICD-10-CM

## 2012-03-22 ENCOUNTER — Encounter: Payer: Self-pay | Admitting: Neurosurgery

## 2012-03-23 ENCOUNTER — Ambulatory Visit (INDEPENDENT_AMBULATORY_CARE_PROVIDER_SITE_OTHER): Payer: Medicare Other | Admitting: Neurosurgery

## 2012-03-23 ENCOUNTER — Other Ambulatory Visit (INDEPENDENT_AMBULATORY_CARE_PROVIDER_SITE_OTHER): Payer: Medicare Other | Admitting: *Deleted

## 2012-03-23 ENCOUNTER — Encounter (INDEPENDENT_AMBULATORY_CARE_PROVIDER_SITE_OTHER): Payer: Medicare Other | Admitting: *Deleted

## 2012-03-23 ENCOUNTER — Encounter: Payer: Self-pay | Admitting: Neurosurgery

## 2012-03-23 VITALS — BP 141/92 | HR 59 | Resp 16 | Ht 69.5 in | Wt 172.0 lb

## 2012-03-23 DIAGNOSIS — I714 Abdominal aortic aneurysm, without rupture: Secondary | ICD-10-CM

## 2012-03-23 DIAGNOSIS — I6529 Occlusion and stenosis of unspecified carotid artery: Secondary | ICD-10-CM

## 2012-03-23 DIAGNOSIS — Z48812 Encounter for surgical aftercare following surgery on the circulatory system: Secondary | ICD-10-CM

## 2012-03-23 NOTE — Progress Notes (Signed)
[facility] AAA/Carotid Office Note  CC: AAA and carotid surveillance Referring Physician: Early  History of Present Illness: 78 year old male patient of Dr. Arbie Cookey who is a remote history of a right CEA in 1998. The patient denies any signs or symptoms of CVA, TIA, amaurosis fugax or any neural deficit. The patient also denies any unusual abdominal or back pain. The patient states he is having "dizziness" that is not of a syncopal nature. The patient states he has been worked up by his primary care physician and was not diagnosed with anything that would cause it. I reassured the patient that his carotid or vertebral flow would not be the etiology of the problem. The patient also states he had a knee arthroplasty with Dr. Darrelyn Hillock one year ago.  Past Medical History  Diagnosis Date  . Hypertension   . Hyperlipidemia   . PVC's (premature ventricular contractions)   . Premature atrial contraction   . GERD (gastroesophageal reflux disease)   . History of TIAs     Questionable CVA in 1989  . Abdominal aortic aneurysm   . Prostate cancer   . History of measles, mumps, or rubella   . Status post carotid endarterectomy     ROS: [x]  Positive   [ ]  Denies    General: [ ]  Weight loss, [ ]  Fever, [ ]  chills Neurologic: [ ]  Dizziness, [ ]  Blackouts, [ ]  Seizure [ ]  Stroke, [ ]  "Mini stroke", [ ]  Slurred speech, [ ]  Temporary blindness; [ ]  weakness in arms or legs, [ ]  Hoarseness Cardiac: [ ]  Chest pain/pressure, [ ]  Shortness of breath at rest [ ]  Shortness of breath with exertion, [ ]  Atrial fibrillation or irregular heartbeat Vascular: [ ]  Pain in legs with walking, [ ]  Pain in legs at rest, [ ]  Pain in legs at night,  [ ]  Non-healing ulcer, [ ]  Blood clot in vein/DVT,   Pulmonary: [ ]  Home oxygen, [ ]  Productive cough, [ ]  Coughing up blood, [ ]  Asthma,  [ ]  Wheezing Musculoskeletal:  [ ]  Arthritis, [ ]  Low back pain, [ ]  Joint pain Hematologic: [ ]  Easy  Bruising, [ ]  Anemia; [ ]  Hepatitis Gastrointestinal: [ ]  Blood in stool, [ ]  Gastroesophageal Reflux/heartburn, [ ]  Trouble swallowing Urinary: [ ]  chronic Kidney disease, [ ]  on HD - [ ]  MWF or [ ]  TTHS, [ ]  Burning with urination, [ ]  Difficulty urinating Skin: [ ]  Rashes, [ ]  Wounds Psychological: [ ]  Anxiety, [ ]  Depression   Social History History  Substance Use Topics  . Smoking status: Former Smoker    Quit date: 04/13/1981  . Smokeless tobacco: Never Used  . Alcohol Use: No    Family History Family History  Problem Relation Age of Onset  . Stroke Father   . Heart failure Mother     Allergies  Allergen Reactions  . Ciprofloxacin Anxiety    Current Outpatient Prescriptions  Medication Sig Dispense Refill  . ALPRAZolam (XANAX) 0.25 MG tablet Take 0.25 mg by mouth as needed.        [name] amLODipine (NORVASC) 10 MG tablet Take 1 tablet by mouth Daily.      [name] aspirin 81 MG tablet Take 81 mg by mouth daily.        [name] atorvastatin (LIPITOR) 40 MG tablet Take 40 mg by mouth daily.        [name] losartan (COZAAR) 50 MG tablet Take 1 tablet (50 mg total) by  mouth daily.  30 tablet  2  . metoprolol succinate (TOPROL-XL) 50 MG 24 hr tablet TAKE 1 TABLET BY MOUTH ONCE DAILY  30 tablet  11  . multivitamin (THERAGRAN) per tablet Take 1 tablet by mouth daily.          Physical Examination  Filed Vitals:   03/23/12 1016  BP: 141/92  Pulse: 59  Resp:     Body mass index is 25.04 kg/(m^2).  General:  WDWN in NAD Gait: Normal HEENT: WNL Eyes: Pupils equal Pulmonary: normal non-labored breathing , without Rales, rhonchi,  wheezing Cardiac: RRR, without  Murmurs, rubs or gallops; Abdomen: soft, NT, no masses Skin: no rashes, ulcers noted  Vascular Exam Pulses: 2+ radial pulses bilaterally, palpable femoral pulses bilaterally, there is no abdominal mass palpated Carotid bruits: Carotid pulses to auscultation no bruits are heard Extremities without ischemic changes, no Gangrene ,  no cellulitis; no open wounds;  Musculoskeletal: no muscle wasting or atrophy   Neurologic: A&O X 3; Appropriate Affect ; SENSATION: normal; MOTOR FUNCTION:  moving all extremities equally. Speech is fluent/normal  Non-Invasive Vascular Imaging CAROTID DUPLEX 03/23/2012  Right ICA 0 - 19% stenosis Left ICA 40 - 59 % stenosis AAA duplex shows a very slight increase from previous exam in 2011 when he was 3.9 x 3.2, today is 3.9 AP by 4.0 transverse  ASSESSMENT/PLAN: Asymptomatic carotid and AAA patient that does have some transient vertigo of unknown etiology. The patient was encouraged to follow back up with his primary care physician regarding this. The patient will followup in one year with repeat carotid and AAA duplex. The patient's questions were encouraged and answered, he is in agreement with this plan.  Lauree Chandler ANP   Clinic MD: Early

## 2012-03-30 HISTORY — PX: EYE SURGERY: SHX253

## 2012-04-08 ENCOUNTER — Other Ambulatory Visit: Payer: Self-pay | Admitting: *Deleted

## 2012-04-08 MED ORDER — LOSARTAN POTASSIUM 50 MG PO TABS: 50.0000 mg | ORAL_TABLET | Freq: Every day | ORAL | Status: DC

## 2012-05-02 ENCOUNTER — Other Ambulatory Visit: Payer: Self-pay | Admitting: *Deleted

## 2012-05-02 DIAGNOSIS — I714 Abdominal aortic aneurysm, without rupture: Secondary | ICD-10-CM

## 2012-05-02 DIAGNOSIS — I6529 Occlusion and stenosis of unspecified carotid artery: Secondary | ICD-10-CM

## 2012-11-23 ENCOUNTER — Other Ambulatory Visit: Payer: Self-pay

## 2012-11-23 ENCOUNTER — Encounter: Payer: Self-pay | Admitting: Cardiology

## 2012-11-23 ENCOUNTER — Ambulatory Visit (INDEPENDENT_AMBULATORY_CARE_PROVIDER_SITE_OTHER): Payer: Medicare Other | Admitting: Cardiology

## 2012-11-23 VITALS — BP 132/80 | HR 74 | Ht 69.5 in | Wt 177.8 lb

## 2012-11-23 DIAGNOSIS — Z8719 Personal history of other diseases of the digestive system: Secondary | ICD-10-CM

## 2012-11-23 DIAGNOSIS — I493 Ventricular premature depolarization: Secondary | ICD-10-CM

## 2012-11-23 DIAGNOSIS — I10 Essential (primary) hypertension: Secondary | ICD-10-CM

## 2012-11-23 DIAGNOSIS — I4949 Other premature depolarization: Secondary | ICD-10-CM

## 2012-11-23 DIAGNOSIS — I714 Abdominal aortic aneurysm, without rupture: Secondary | ICD-10-CM

## 2012-11-23 NOTE — Progress Notes (Signed)
Venetia Night Marcella Date of Birth: [birth year]   History of Present Illness: Eric Carpenter is seen today for followup. He still reports  frequent palpitations. This usually occurs when he first gets up in the morning. It is less noticeable during the day. 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 He is avoiding caffeine. He denies any chest pain or shortness of breath. He has a known history of PACs and PVCs and has worn monitors on 2 occasions in the past. He is walking 3 miles 5 days a week.  Current Outpatient Prescriptions on File Prior to Visit  Medication Sig Dispense Refill  . ALPRAZolam (XANAX) 0.25 MG tablet Take 0.25 mg by mouth as needed.        [name] amLODipine (NORVASC) 10 MG tablet Take 1 tablet by mouth Daily.      [name] aspirin 81 MG tablet Take 81 mg by mouth daily.        [name] atorvastatin (LIPITOR) 40 MG tablet Take 40 mg by mouth daily.        [name] losartan (COZAAR) 50 MG tablet Take 1 tablet (50 mg total) by mouth daily.  30 tablet  6  . metoprolol succinate (TOPROL-XL) 50 MG 24 hr tablet TAKE 1 TABLET BY MOUTH ONCE DAILY  30 tablet  11  . multivitamin (THERAGRAN) per tablet Take 1 tablet by mouth daily.         No current facility-administered medications on file prior to visit.    Allergies  Allergen Reactions  . Ciprofloxacin Anxiety    Past Medical History  Diagnosis Date  . Hypertension   . Hyperlipidemia   . PVC's (premature ventricular contractions)   . Premature atrial contraction   . GERD (gastroesophageal reflux disease)   . History of TIAs     Questionable CVA in 1989  . Abdominal aortic aneurysm   . Prostate cancer   . History of measles, mumps, or rubella   . Status post carotid endarterectomy     Past Surgical History  Procedure Laterality Date  . Appendectomy    . Tonsillectomy    . Hernia repair    . Prostate surgery    . Total knee arthroplasty      right  . Carotid endarterectomy      right  . Eye  surgery  July 2012    Cataract Bilateral eye  . Eye surgery  Dec. 18, 2013    Right lower eyelid surgery    History  Smoking status  . Former Smoker  . Quit date: 04/13/1981  Smokeless tobacco  . Never Used    History  Alcohol Use No    Family History  Problem Relation Age of Onset  . Stroke Father   . Heart failure Mother     Review of Systems: As noted in history of present illness. He is concerned that he has an inguinal hernia. He has had a previous inguinal hernia repair in 2001. All other systems were reviewed and are negative.  Physical Exam: BP 132/80  Pulse 74  Ht 5' 9.5" (1.765 m)  Wt 177 lb 12.8 oz (80.65 kg)  BMI 25.89 kg/m2  SpO2 96% He is a pleasant white male in no acute distress. HEENT exam is normal. Neck is supple no JVD adenopathy thyromegaly or bruits. He has an old right carotid endarterectomy scar. Lungs are clear. Cardiac exam reveals a regular rate and rhythm with occasional extrasystoles, without gallop,  murmur, or click. Abdomen is soft and nontender without masses or bruits. Extremities are without edema. He has good pedal pulses. He is alert and oriented x3. Cranial nerves II through XII are intact. LABORATORY DATA:  Assessment / Plan: 1. Palpitations related to chronic PACs and PVCs. He is on appropriate dose of metoprolol now. I think his symptoms are exacerbated by stress.  2. Hypertension  3. Hyperlipidemia.

## 2012-11-23 NOTE — Patient Instructions (Signed)
Continue your current therapy. Avoid caffeine.  Continue your aerobic activity.  We will schedule you to see general surgery.  I will see you in 6 months.

## 2012-12-05 ENCOUNTER — Ambulatory Visit (INDEPENDENT_AMBULATORY_CARE_PROVIDER_SITE_OTHER): Payer: Self-pay | Admitting: Surgery

## 2012-12-07 ENCOUNTER — Encounter (INDEPENDENT_AMBULATORY_CARE_PROVIDER_SITE_OTHER): Payer: Self-pay | Admitting: Surgery

## 2012-12-07 ENCOUNTER — Ambulatory Visit (INDEPENDENT_AMBULATORY_CARE_PROVIDER_SITE_OTHER): Payer: Medicare Other | Admitting: Surgery

## 2012-12-07 VITALS — BP 138/76 | HR 64 | Resp 16 | Ht 69.0 in | Wt 174.0 lb

## 2012-12-07 DIAGNOSIS — R1031 Right lower quadrant pain: Secondary | ICD-10-CM

## 2012-12-07 NOTE — Progress Notes (Signed)
Patient ID: Eric Carpenter, male   DOB: 21-Nov-1926, 79 y.o.   MRN: [id number]  Chief Complaint  Patient presents with  . New Evaluation    eval ing hernia    HPI [name] is a 79 y.o. male.  Referred by Dr. Peter Swaziland for evaluation of possible recurrent right inguinal hernia  HPI This is an 79 year old male with some chronic medical issues who is s/p open repair of a pantaloon right inguinal hernia by Dr. Kendrick Ranch in 2001.  This was performed with mesh.  The patient is quite active, walking several miles a day and doing a lot of heavy work around his house.  Recently, he noticed some discomfort coming from near his right hip radiating down towards his groin and upper medial thigh.  No bulge or swelling noted.  He mentioned this to Dr. Swaziland, who referred him for surgical evaluation.  He denies any digestive or urinary symptoms.  He had a left inguinal hernia repair (without mesh) in the 1950s.  Past Medical History  Diagnosis Date  . Hypertension   . Hyperlipidemia   . PVC's (premature ventricular contractions)   . Premature atrial contraction   . GERD (gastroesophageal reflux disease)   . History of TIAs     Questionable CVA in 1989  . Abdominal aortic aneurysm   . Prostate cancer   . History of measles, mumps, or rubella   . Status post carotid endarterectomy     Past Surgical History  Procedure Laterality Date  . Appendectomy    . Tonsillectomy    . Hernia repair    . Prostate surgery    . Total knee arthroplasty      right  . Carotid endarterectomy      right  . Eye surgery  July 2012    Cataract Bilateral eye  . Eye surgery  Dec. 18, 2013    Right lower eyelid surgery    Family History  Problem Relation Age of Onset  . Stroke Father   . Heart failure Mother     Social History History  Substance Use Topics  . Smoking status: Former Smoker    Quit date: 04/14/1979  . Smokeless tobacco: Never Used  . Alcohol Use: No    Allergies  Allergen  Reactions  . Ciprofloxacin Anxiety    Current Outpatient Prescriptions  Medication Sig Dispense Refill  . ALPRAZolam (XANAX) 0.25 MG tablet Take 0.25 mg by mouth as needed.        [name] amLODipine (NORVASC) 10 MG tablet Take 1 tablet by mouth Daily.      [name] aspirin 81 MG tablet Take 81 mg by mouth daily.        [name] atorvastatin (LIPITOR) 40 MG tablet Take 40 mg by mouth daily.        . clindamycin (CLEOCIN) 150 MG capsule       . losartan (COZAAR) 50 MG tablet Take 1 tablet (50 mg total) by mouth daily.  30 tablet  6  . metoprolol succinate (TOPROL-XL) 50 MG 24 hr tablet TAKE 1 TABLET BY MOUTH ONCE DAILY  30 tablet  11  . multivitamin (THERAGRAN) per tablet Take 1 tablet by mouth daily.        [name] sulfamethoxazole-trimethoprim (BACTRIM DS) 800-160 MG per tablet        No current facility-administered medications for this visit.    Review of Systems Review of Systems  Constitutional: Negative for fever, chills and unexpected weight change.  HENT: Negative for hearing loss, congestion, sore throat, trouble swallowing and voice change.   Eyes: Negative for visual disturbance.  Respiratory: Negative for cough and wheezing.   Cardiovascular: Negative for chest pain, palpitations and leg swelling.  Gastrointestinal: Negative for nausea, vomiting, abdominal pain, diarrhea, constipation, blood in stool, abdominal distention, anal bleeding and rectal pain.  Genitourinary: Negative for hematuria and difficulty urinating.  Musculoskeletal: Negative for arthralgias.  Skin: Negative for rash and wound.  Neurological: Negative for seizures, syncope, weakness and headaches.  Hematological: Negative for adenopathy. Does not bruise/bleed easily.  Psychiatric/Behavioral: Negative for confusion.    Blood pressure 138/76, pulse 64, resp. rate 16, height 5\' 9"  (1.753 m), weight 174 lb (78.926 kg).  Physical Exam Physical Exam WDWN elderly male in NAD HEENT:  EOMI, sclera anicteric Neck:  No masses, no  thyromegaly Lungs:  CTA bilaterally; normal respiratory effort CV:  Regular rate and rhythm; no murmurs Abd:  +bowel sounds, soft, non-tender, no masses GU:  Bilateral descended testes; no testicular masses; no sign of inguinal hernia on either side with Valsalva maneuver Ext:  Well-perfused; no edema Skin:  Warm, dry; no sign of jaundice  Data Reviewed none  Assessment    No sign of recurrent hernia.  He may have had a strain of the muscle/ mesh in his right groin due to over-exertion and heavy lifting.  This probably resulted in some inflammation, but no sign of hernia.     Plan    No surgical treatment indicated.  Limit heavy lifting.  NSAIDS - aleve or ibuprofen. Follow-up PRN        Neiman Roots K. 12/07/2012, 4:53 PM

## 2013-01-23 ENCOUNTER — Other Ambulatory Visit: Payer: Self-pay | Admitting: Cardiology

## 2013-02-22 ENCOUNTER — Other Ambulatory Visit: Payer: Self-pay | Admitting: Neurosurgery

## 2013-02-22 DIAGNOSIS — Z48812 Encounter for surgical aftercare following surgery on the circulatory system: Secondary | ICD-10-CM

## 2013-02-22 DIAGNOSIS — I6529 Occlusion and stenosis of unspecified carotid artery: Secondary | ICD-10-CM

## 2013-03-21 ENCOUNTER — Encounter: Payer: Self-pay | Admitting: Family

## 2013-03-22 ENCOUNTER — Encounter (HOSPITAL_COMMUNITY): Payer: Medicare Other

## 2013-03-22 ENCOUNTER — Encounter: Payer: Self-pay | Admitting: Family

## 2013-03-22 ENCOUNTER — Ambulatory Visit (INDEPENDENT_AMBULATORY_CARE_PROVIDER_SITE_OTHER): Payer: Medicare Other | Admitting: Family

## 2013-03-22 ENCOUNTER — Encounter (INDEPENDENT_AMBULATORY_CARE_PROVIDER_SITE_OTHER): Payer: Self-pay

## 2013-03-22 ENCOUNTER — Other Ambulatory Visit (HOSPITAL_COMMUNITY): Payer: Medicare Other

## 2013-03-22 ENCOUNTER — Ambulatory Visit (INDEPENDENT_AMBULATORY_CARE_PROVIDER_SITE_OTHER)
Admission: RE | Admit: 2013-03-22 | Discharge: 2013-03-22 | Disposition: A | Discharge status: Home / Self Care | Payer: Medicare Other | Source: Ambulatory Visit | Attending: Family | Admitting: Family

## 2013-03-22 ENCOUNTER — Ambulatory Visit (HOSPITAL_COMMUNITY)
Admission: RE | Admit: 2013-03-22 | Discharge: 2013-03-22 | Disposition: A | Discharge status: Home / Self Care | Payer: Medicare Other | Source: Ambulatory Visit | Attending: Family | Admitting: Family

## 2013-03-22 VITALS — BP 138/79 | HR 57 | Resp 16 | Ht 69.5 in | Wt 174.0 lb

## 2013-03-22 DIAGNOSIS — Z48812 Encounter for surgical aftercare following surgery on the circulatory system: Secondary | ICD-10-CM

## 2013-03-22 DIAGNOSIS — I6529 Occlusion and stenosis of unspecified carotid artery: Secondary | ICD-10-CM | POA: Insufficient documentation

## 2013-03-22 DIAGNOSIS — I714 Abdominal aortic aneurysm, without rupture, unspecified: Secondary | ICD-10-CM | POA: Insufficient documentation

## 2013-03-22 NOTE — Patient Instructions (Addendum)
Stroke Prevention Some medical conditions and behaviors are associated with an increased chance of having a stroke. You may prevent a stroke by making healthy choices and managing medical conditions. Reduce your risk of having a stroke by:  Staying physically active. Get at least 30 minutes of activity on most or all days.  Not smoking. It may also be helpful to avoid exposure to secondhand smoke.  Limiting alcohol use. Moderate alcohol use is considered to be:  No more than 2 drinks per day for men.  No more than 1 drink per day for nonpregnant women.  Eating healthy foods.  Include 5 or more servings of fruits and vegetables a day.  Certain diets may be prescribed to address high blood pressure, high cholesterol, diabetes, or obesity.  Managing your cholesterol levels.  A low-saturated fat, low-trans fat, low-cholesterol, and high-fiber diet may control cholesterol levels.  Take any prescribed medicines to control cholesterol as directed by your caregiver.  Managing your diabetes.  A controlled-carbohydrate, controlled-sugar diet is recommended to manage diabetes.  Take any prescribed medicines to control diabetes as directed by your caregiver.  Controlling your high blood pressure (hypertension).  A low-salt (sodium), low-saturated fat, low-trans fat, and low-cholesterol diet is recommended to manage high blood pressure.  Take any prescribed medicines to control hypertension as directed by your caregiver.  Maintaining a healthy weight.  A reduced-calorie, low-sodium, low-saturated fat, low-trans fat, low-cholesterol diet is recommended to manage weight.  Stopping drug abuse.  Avoiding birth control pills.  Talk to your caregiver about the risks of taking birth control pills if you are over 86 years old, smoke, get migraines, or have ever had a blood clot.  Getting evaluated for sleep disorders (sleep apnea).  Talk to your caregiver about getting a sleep evaluation  if you snore a lot or have excessive sleepiness.  Taking medicines as directed by your caregiver.  For some people, aspirin or blood thinners (anticoagulants) are helpful in reducing the risk of forming abnormal blood clots that can lead to stroke. If you have the irregular heart rhythm of atrial fibrillation, you should be on a blood thinner unless there is a good reason you cannot take them.  Understand all your medicine instructions. SEEK IMMEDIATE MEDICAL CARE IF:   You have sudden weakness or numbness of the face, arm, or leg, especially on one side of the body.  You have sudden confusion.  You have trouble speaking (aphasia) or understanding.  You have sudden trouble seeing in one or both eyes.  You have sudden trouble walking.  You have dizziness.  You have a loss of balance or coordination.  You have a sudden, severe headache with no known cause.  You have new chest pain or an irregular heartbeat. Any of these symptoms may represent a serious problem that is an emergency. Do not wait to see if the symptoms will go away. Get medical help right away. Call your local emergency services (911 in [place] Do not drive yourself to the hospital. Document Released: 05/07/2004 Document Revised: 06/22/2011 Document Reviewed: 09/30/2012 [facility] Patient Information 2014 [place], [place].   Abdominal Aortic Aneurysm An aneurysm is a weakened or damaged part of an artery wall that bulges from the normal force of blood pumping through the body. An abdominal aortic aneurysm is an aneurysm that occurs in the lower part of the aorta, the main artery of the body.  The major concern with an abdominal aortic aneurysm is that it can enlarge and burst (rupture) or  blood can flow between the layers of the wall of the aorta through a tear (aorticdissection). Both of these conditions can cause bleeding inside the body and can be life threatening unless diagnosed and treated promptly. CAUSES  The exact  cause of an abdominal aortic aneurysm is unknown. Some contributing factors are:   A hardening of the arteries caused by the buildup of fat and other substances in the lining of a blood vessel (arteriosclerosis).  Inflammation of the walls of an artery (arteritis).   Connective tissue diseases, such as Marfan syndrome.   Abdominal trauma.   An infection, such as syphilis or staphylococcus, in the wall of the aorta (infectious aortitis) caused by bacteria. RISK FACTORS  Risk factors that contribute to an abdominal aortic aneurysm may include:  Age older than 60 years.   High blood pressure (hypertension).  Male gender.  Ethnicity (white race).  Obesity.  Family history of aneurysm (first degree relatives only).  Tobacco use. PREVENTION  The following healthy lifestyle habits may help decrease your risk of abdominal aortic aneurysm:  Quitting smoking. Smoking can raise your blood pressure and cause arteriosclerosis.  Limiting or avoiding alcohol.  Keeping your blood pressure, blood sugar level, and cholesterol levels within normal limits.  Decreasing your salt intake. In somepeople, too much salt can raise blood pressure and increase your risk of abdominal aortic aneurysm.  Eating a diet low in saturated fats and cholesterol.  Increasing your fiber intake by including whole grains, vegetables, and fruits in your diet. Eating these foods may help lower blood pressure.  Maintaining a healthy weight.  Staying physically active and exercising regularly. SYMPTOMS  The symptoms of abdominal aortic aneurysm may vary depending on the size and rate of growth of the aneurysm.Most grow slowly and do not have any symptoms. When symptoms do occur, they may include:  Pain (abdomen, side, lower back, or groin). The pain may vary in intensity. A sudden onset of severe pain may indicate that the aneurysm has ruptured.  Feeling full after eating only small amounts of  food.  Nausea or vomiting or both.  Feeling a pulsating lump in the abdomen.  Feeling faint or passing out. DIAGNOSIS  Since most unruptured abdominal aortic aneurysms have no symptoms, they are often discovered during diagnostic exams for other conditions. An aneurysm may be found during the following procedures:  Ultrasonography (A one-time screening for abdominal aortic aneurysm by ultrasonography is also recommended for all men aged 65-75 years who have ever smoked).  X-ray exams.  A computed tomography (CT).  Magnetic resonance imaging (MRI).  Angiography or arteriography. TREATMENT  Treatment of an abdominal aortic aneurysm depends on the size of your aneurysm, your age, and risk factors for rupture. Medication to control blood pressure and pain may be used to manage aneurysms smaller than 6 cm. Regular monitoring for enlargement may be recommended by your caregiver if:  The aneurysm is 3 4 cm in size (an annual ultrasonography may be recommended).  The aneurysm is 4 4.5 cm in size (an ultrasonography every 6 months may be recommended).  The aneurysm is larger than 4.5 cm in size (your caregiver may ask that you be examined by a vascular surgeon). If your aneurysm is larger than 6 cm, surgical repair may be recommended. There are two main methods for repair of an aneurysm:   Endovascular repair (a minimally invasive surgery). This is done most often.  Open repair. This method is used if an endovascular repair is not possible. Document  Released: 01/07/2005 Document Revised: 07/25/2012 Document Reviewed: 04/29/2012 [facility] Patient Information 2014 [place], [place].

## 2013-03-22 NOTE — Progress Notes (Signed)
Established Eric Carpenter  History of Present Illness  Eric Carpenter is a 79 y.o. male Carpenter of Dr. Arbie Cookey who is a remote history of a right CEA in 1998. He returns today for surveillance of Eric arteries and AAA.  Carpenter has possible history of TIA or stroke but he had no symptoms other than weakness which resolved, states MRI of the brain at that time was negative for stroke.  The Carpenter denies amaurosis fugax or monocular blindness.  The Carpenter  denies facial drooping.  Pt. denies hemiplegia.  The Carpenter denies receptive or expressive aphasia.  Pt. denies extremity weakness.  He denies back or abdominal pain that could be referable to AAA. He has low abdominal pain that is brouhgt on by abdominal straining, does not have abdominal pain at rest. He has chronic lumbar spine issues being evaluated by Dr. Darrelyn Hillock.  He had a right knee replacement. He walks 3 miles daily, denies claudication symptoms, denies non-healing wounds. Seeing Dr. Peter [place] for irregular heartbeat, but denies that this is atrial fib., is taking a beta blocker for this.  Carpenter  denies New Medical or Surgical History.  Pt Diabetic: No Pt smoker: former smoker, quit in 1981  Pt meds include: Statin : Yes Betablocker: Yes ASA: Yes Other anticoagulants/antiplatelets: no   Past Medical History  Diagnosis Date  . Hypertension   . Hyperlipidemia   . PVC's (premature ventricular contractions)   . Premature atrial contraction   . GERD (gastroesophageal reflux disease)   . History of TIAs     Questionable CVA in 1989  . Abdominal aortic aneurysm   . Prostate cancer   . History of measles, mumps, or rubella   . Status post Eric endarterectomy     Social History History  Substance Use Topics  . Smoking status: Former Smoker    Quit date: 04/14/1979  . Smokeless tobacco: Never Used  . Alcohol Use: No    Family History Family History  Problem Relation Age of Onset  . Stroke Father    . Heart failure Mother     Surgical History Past Surgical History  Procedure Laterality Date  . Appendectomy    . Tonsillectomy    . Hernia repair    . Prostate surgery    . Total knee arthroplasty      right  . Eric endarterectomy      right  . Eye surgery  July 2012    Cataract Bilateral eye  . Eye surgery  Dec. 18, 2013    Right lower eyelid surgery    Allergies  Allergen Reactions  . Ciprofloxacin Anxiety    Current Outpatient Prescriptions  Medication Sig Dispense Refill  . ALPRAZolam (XANAX) 0.25 MG tablet Take 0.25 mg by mouth as needed.        [name] amLODipine (NORVASC) 10 MG tablet Take 1 tablet by mouth Daily.      [name] aspirin 81 MG tablet Take 81 mg by mouth daily.        [name] atorvastatin (LIPITOR) 40 MG tablet Take 40 mg by mouth daily.        . clindamycin (CLEOCIN) 150 MG capsule       . losartan (COZAAR) 50 MG tablet Take 1 tablet (50 mg total) by mouth daily.  30 tablet  6  . metoprolol succinate (TOPROL-XL) 50 MG 24 hr tablet TAKE 1 TABLET BY MOUTH ONCE DAILY  30 tablet  3  . multivitamin (THERAGRAN) per tablet Take 1 tablet by  mouth daily.        [name] sulfamethoxazole-trimethoprim (BACTRIM DS) 800-160 MG per tablet        No current facility-administered medications for this visit.    Physical Examination  Filed Vitals:   03/22/13 1006  BP: 138/79  Pulse: 57  Resp: 16   Filed Weights   03/22/13 1006  Weight: 174 lb (78.926 kg)   Body mass index is 25.34 kg/(m^2).  General: WDWN male in NAD GAIT: normal Eyes: PERRLA Pulmonary:  CTAB, Negative  Rales, Negative rhonchi, & Negative wheezing.  Cardiac: regular Rhythm with occasional missed beat,  Negative Murmur detected.  VASCULAR EXAM Eric Bruits Left Right   Negative Negative    Aorta is not palpable. Radial pulses are 2+ palpable and equal.                                                                                                                            LE Pulses LEFT RIGHT        FEMORAL   palpable   palpable        POPLITEAL  not palpable   not palpable       POSTERIOR TIBIAL   palpable    palpable        DORSALIS PEDIS      ANTERIOR TIBIAL  palpable   palpable     Gastrointestinal: soft, nontender, BS WNL, no r/g,  negative masses.  Musculoskeletal: Negative muscle atrophy/wasting. M/S 5/5 throughout, Extremities without ischemic changes.  Neurologic: A&O X 3; Appropriate Affect ; SENSATION ;normal;  Speech is normal CN 2-12 intact, Pain and light touch intact in extremities, Motor exam as listed above.   Non-Invasive Vascular Imaging Eric DUPLEX 03/22/2013   Right ICA: <40% stenosis. Left ICA: 40 - 59 % stenosis.  These findings are Unchanged from previous exam.  AAA Duplex: Today: 4.2 x 4.2 cm distal 03/23/12: 3.9 cm as largest diameter  Assessment: Eric Carpenter is a 79 y.o. male who presents with asymptomatic <40% Right ICA  Stenosis and 40-59% right ICA stenosis. The  ICA stenosis is  Unchanged from previous exam.  His AAA has increased by 0.3 cm in 1 year which is a slight increase in size.  Plan: Based on today's exam and Duplex results, Carpenter is advised to follow-up in 1 year with Eric Duplex scan and AAA Duplex.   I discussed in depth with the Carpenter the nature of atherosclerosis, and emphasized the importance of maximal medical management including strict control of blood pressure, blood glucose, and lipid levels, obtaining regular exercise, and continued cessation of smoking.  The Carpenter is aware that without maximal medical management the underlying atherosclerotic disease process will progress, limiting the benefit of any interventions.   The Carpenter was given information about stroke prevention and AAA and what symptoms should prompt the Carpenter to seek immediate medical care.  Thank you for allowing [place] to participate in this Carpenter's care.  Rosalita Chessman  Marshall Kampf, RN, MSN, FNP-C [facility]  [place] Office: [phone]  Clinic Physician: Edilia Bo  03/22/2013 9:21 AM

## 2013-03-28 ENCOUNTER — Other Ambulatory Visit: Payer: Medicare Other

## 2013-03-28 ENCOUNTER — Ambulatory Visit: Payer: Medicare Other | Admitting: Neurosurgery

## 2013-05-04 ENCOUNTER — Other Ambulatory Visit: Payer: Self-pay | Admitting: Cardiology

## 2013-06-13 ENCOUNTER — Other Ambulatory Visit: Payer: Self-pay | Admitting: Cardiology

## 2013-07-11 DIAGNOSIS — D3142 Benign neoplasm of left ciliary body: Secondary | ICD-10-CM | POA: Insufficient documentation

## 2013-07-18 ENCOUNTER — Encounter: Payer: Self-pay | Admitting: Cardiology

## 2013-07-25 ENCOUNTER — Other Ambulatory Visit: Payer: Self-pay | Admitting: Cardiology

## 2013-08-07 ENCOUNTER — Other Ambulatory Visit: Payer: Self-pay | Admitting: Cardiology

## 2013-09-07 ENCOUNTER — Other Ambulatory Visit: Payer: Self-pay | Admitting: Cardiology

## 2013-09-25 ENCOUNTER — Ambulatory Visit (INDEPENDENT_AMBULATORY_CARE_PROVIDER_SITE_OTHER): Payer: Medicare Other | Admitting: Cardiology

## 2013-09-25 ENCOUNTER — Encounter: Payer: Self-pay | Admitting: Cardiology

## 2013-09-25 VITALS — BP 130/78 | Ht 69.5 in | Wt 173.4 lb

## 2013-09-25 DIAGNOSIS — I10 Essential (primary) hypertension: Secondary | ICD-10-CM

## 2013-09-25 DIAGNOSIS — I493 Ventricular premature depolarization: Secondary | ICD-10-CM

## 2013-09-25 DIAGNOSIS — I4949 Other premature depolarization: Secondary | ICD-10-CM

## 2013-09-25 DIAGNOSIS — I491 Atrial premature depolarization: Secondary | ICD-10-CM

## 2013-09-25 DIAGNOSIS — E785 Hyperlipidemia, unspecified: Secondary | ICD-10-CM

## 2013-09-25 NOTE — Progress Notes (Signed)
Frutoso Schatz Roeder Date of Birth: November 22, 1926   History of Present Illness: Mr. Majano is seen today for followup. He is concerned that his balance is worse and he tends to fall especially when he turns. He also notes strange sensations in his head like a tightness or balloon being inflated. He has weakness in both arms and legs with walking on the track. He is scheduled for cranial MRI.  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 He denies any palpitations. He denies any chest pain or shortness of breath. He has a known history of PACs and PVCs and has worn monitors on 2 occasions in the past. He does exercise regularly.  Current Outpatient Prescriptions on File Prior to Visit  Medication Sig Dispense Refill  . ALPRAZolam (XANAX) 0.25 MG tablet Take 0.25 mg by mouth as needed.        [name] amLODipine (NORVASC) 10 MG tablet Take 1 tablet by mouth Daily.      [name] aspirin 81 MG tablet Take 81 mg by mouth daily.        [name] atorvastatin (LIPITOR) 40 MG tablet Take 40 mg by mouth daily.        [name] losartan (COZAAR) 50 MG tablet TAKE 1 TABLET BY MOUTH ONCE DAILY  30 tablet  1  . metoprolol succinate (TOPROL-XL) 50 MG 24 hr tablet TAKE 1 TABLET BY MOUTH ONCE DAILY  30 tablet  0  . multivitamin (THERAGRAN) per tablet Take 1 tablet by mouth daily. One-A-Day for Women       No current facility-administered medications on file prior to visit.    Allergies  Allergen Reactions  . Ciprofloxacin Anxiety  . Cleocin [Clindamycin Hcl] Anxiety  . Neomycin   . Tobradex [Tobramycin-Dexamethasone]     Past Medical History  Diagnosis Date  . Hypertension   . Hyperlipidemia   . PVC's (premature ventricular contractions)   . Premature atrial contraction   . GERD (gastroesophageal reflux disease)   . History of TIAs     Questionable CVA in 1989  . Abdominal aortic aneurysm   . Prostate cancer   . History of measles, mumps, or rubella   . Status post carotid  endarterectomy   . Carotid artery occlusion     Past Surgical History  Procedure Laterality Date  . Appendectomy    . Tonsillectomy    . Hernia repair    . Prostate surgery    . Total knee arthroplasty      right  . Eye surgery  July 2012    Cataract Bilateral eye  . Eye surgery  Dec. 18, 2013    Right lower eyelid surgery  . Carotid endarterectomy Right 1998    cea    History  Smoking status  . Former Smoker  . Quit date: 04/14/1979  Smokeless tobacco  . Never Used    History  Alcohol Use No    Family History  Problem Relation Age of Onset  . Stroke Father   . Heart failure Mother     Review of Systems: As noted in history of present illness.  All other systems were reviewed and are negative.  Physical Exam: BP 130/78  Ht 5' 9.5" (1.765 m)  Wt 173 lb 6.4 oz (78.654 kg)  BMI 25.25 kg/m2 He is a pleasant white male in no acute distress. HEENT exam is normal. Neck is supple no JVD adenopathy thyromegaly or bruits. He has an old right  carotid endarterectomy scar. Lungs are clear. Cardiac exam reveals a regular rate and rhythm  without gallop, murmur, or click. Abdomen is soft and nontender without masses or bruits. Extremities are without edema. He has good pedal pulses. He is alert and oriented x3. Cranial nerves II through XII are intact. No focal motor or sensory deficits. Gait is wide based and he is unable to walk heel to toe.  LABORATORY DATA: Dated 09/22/13: Normal chemistry panel and CBC. UA is normal. Cholesterol 195, triglycerides 285, HDL 39, LDL 99.  Ecg: NSR with 1st degree AV block. Otherwise normal.  Assessment / Plan: 1. Chronic PACs and PVCs. He is on appropriate dose of metoprolol now. Not very symptomatic now.   2. Hypertension- controlled. Continue current therapy  3. Hyperlipidemia. Triglycerides are elevated. Consider adding fish oil.   4. Imbalance. Doubt CVA but will await MRI results. May benefit from PT.

## 2013-09-25 NOTE — Patient Instructions (Signed)
Continue your current therapy  I will see you in 6 months.   

## 2013-10-21 ENCOUNTER — Other Ambulatory Visit: Payer: Self-pay | Admitting: Cardiology

## 2014-03-04 ENCOUNTER — Emergency Department (HOSPITAL_COMMUNITY): Payer: Medicare Other

## 2014-03-04 ENCOUNTER — Observation Stay (HOSPITAL_COMMUNITY)
Admission: EM | Admit: 2014-03-04 | Discharge: 2014-03-05 | Disposition: A | Discharge status: Home / Self Care | Payer: Medicare Other | Attending: Cardiology | Admitting: Cardiology

## 2014-03-04 ENCOUNTER — Encounter (HOSPITAL_COMMUNITY): Payer: Self-pay | Admitting: Emergency Medicine

## 2014-03-04 DIAGNOSIS — Z7982 Long term (current) use of aspirin: Secondary | ICD-10-CM | POA: Insufficient documentation

## 2014-03-04 DIAGNOSIS — K219 Gastro-esophageal reflux disease without esophagitis: Secondary | ICD-10-CM | POA: Diagnosis not present

## 2014-03-04 DIAGNOSIS — I491 Atrial premature depolarization: Secondary | ICD-10-CM | POA: Diagnosis not present

## 2014-03-04 DIAGNOSIS — Z8673 Personal history of transient ischemic attack (TIA), and cerebral infarction without residual deficits: Secondary | ICD-10-CM | POA: Insufficient documentation

## 2014-03-04 DIAGNOSIS — I2510 Atherosclerotic heart disease of native coronary artery without angina pectoris: Secondary | ICD-10-CM | POA: Diagnosis present

## 2014-03-04 DIAGNOSIS — R0789 Other chest pain: Secondary | ICD-10-CM | POA: Diagnosis not present

## 2014-03-04 DIAGNOSIS — R109 Unspecified abdominal pain: Secondary | ICD-10-CM

## 2014-03-04 DIAGNOSIS — C61 Malignant neoplasm of prostate: Secondary | ICD-10-CM | POA: Diagnosis not present

## 2014-03-04 DIAGNOSIS — Z881 Allergy status to other antibiotic agents status: Secondary | ICD-10-CM | POA: Diagnosis not present

## 2014-03-04 DIAGNOSIS — R072 Precordial pain: Principal | ICD-10-CM | POA: Insufficient documentation

## 2014-03-04 DIAGNOSIS — I6523 Occlusion and stenosis of bilateral carotid arteries: Secondary | ICD-10-CM | POA: Insufficient documentation

## 2014-03-04 DIAGNOSIS — I739 Peripheral vascular disease, unspecified: Secondary | ICD-10-CM | POA: Diagnosis present

## 2014-03-04 DIAGNOSIS — I714 Abdominal aortic aneurysm, without rupture, unspecified: Secondary | ICD-10-CM | POA: Diagnosis present

## 2014-03-04 DIAGNOSIS — I724 Aneurysm of artery of lower extremity: Secondary | ICD-10-CM | POA: Diagnosis not present

## 2014-03-04 DIAGNOSIS — I493 Ventricular premature depolarization: Secondary | ICD-10-CM | POA: Diagnosis present

## 2014-03-04 DIAGNOSIS — E785 Hyperlipidemia, unspecified: Secondary | ICD-10-CM

## 2014-03-04 DIAGNOSIS — I2584 Coronary atherosclerosis due to calcified coronary lesion: Secondary | ICD-10-CM

## 2014-03-04 DIAGNOSIS — K760 Fatty (change of) liver, not elsewhere classified: Secondary | ICD-10-CM | POA: Diagnosis present

## 2014-03-04 DIAGNOSIS — I723 Aneurysm of iliac artery: Secondary | ICD-10-CM | POA: Diagnosis not present

## 2014-03-04 DIAGNOSIS — I10 Essential (primary) hypertension: Secondary | ICD-10-CM | POA: Diagnosis not present

## 2014-03-04 HISTORY — DX: Peripheral vascular disease, unspecified: I73.9

## 2014-03-04 HISTORY — DX: Fatty (change of) liver, not elsewhere classified: K76.0

## 2014-03-04 HISTORY — DX: Personal history of other medical treatment: Z92.89

## 2014-03-04 LAB — CBC
HCT: 47.2 % (ref 39.0–52.0)
Hemoglobin: 16 g/dL (ref 13.0–17.0)
MCH: 30.5 pg (ref 26.0–34.0)
MCHC: 33.9 g/dL (ref 30.0–36.0)
MCV: 89.9 fL (ref 78.0–100.0)
PLATELETS: 266 10*3/uL (ref 150–400)
RBC: 5.25 MIL/uL (ref 4.22–5.81)
RDW: 12.5 % (ref 11.5–15.5)
WBC: 7.7 10*3/uL (ref 4.0–10.5)

## 2014-03-04 LAB — I-STAT TROPONIN, ED
Troponin i, poc: 0 ng/mL (ref 0.00–0.08)
Troponin i, poc: 0 ng/mL (ref 0.00–0.08)
Troponin i, poc: 0.01 ng/mL (ref 0.00–0.08)

## 2014-03-04 LAB — TROPONIN I: Troponin I: <0.3 ng/mL (ref <0.30)

## 2014-03-04 LAB — BASIC METABOLIC PANEL
Anion gap: 16 — ABNORMAL HIGH (ref 5–15)
BUN: 17 mg/dL (ref 6–23)
CHLORIDE: 99 meq/L (ref 96–112)
CO2: 24 meq/L (ref 19–32)
Calcium: 9.3 mg/dL (ref 8.4–10.5)
Creatinine, Ser: 0.88 mg/dL (ref 0.50–1.35)
GFR calc Af Amer: 88 mL/min — ABNORMAL LOW (ref >90)
GFR calc non Af Amer: 76 mL/min — ABNORMAL LOW (ref >90)
Glucose, Bld: 105 mg/dL — ABNORMAL HIGH (ref 70–99)
Potassium: 4.4 mEq/L (ref 3.7–5.3)
Sodium: 139 mEq/L (ref 137–147)

## 2014-03-04 MED ORDER — REGADENOSON 0.4 MG/5ML IV SOLN
0.4000 mg | Freq: Once | INTRAVENOUS | Status: AC
  Administered 2014-03-05: 0.4 mg via INTRAVENOUS
  Filled 2014-03-04: qty 5

## 2014-03-04 MED ORDER — ACETAMINOPHEN 325 MG PO TABS: 650.0000 mg | ORAL_TABLET | ORAL | Status: DC | PRN

## 2014-03-04 MED ORDER — ATORVASTATIN CALCIUM 40 MG PO TABS
40.0000 mg | ORAL_TABLET | Freq: Every day | ORAL | Status: DC
Start: 2014-03-04 — End: 2014-03-05
  Administered 2014-03-04 – 2014-03-05 (×2): 40 mg via ORAL
  Filled 2014-03-04 (×2): qty 1

## 2014-03-04 MED ORDER — ADULT MULTIVITAMIN W/MINERALS CH
1.0000 | ORAL_TABLET | Freq: Every day | ORAL | Status: DC
  Administered 2014-03-04 – 2014-03-05 (×2): 1 via ORAL
  Filled 2014-03-04 (×2): qty 1

## 2014-03-04 MED ORDER — LOSARTAN POTASSIUM 50 MG PO TABS
100.0000 mg | ORAL_TABLET | Freq: Every day | ORAL | Status: DC
  Administered 2014-03-04 – 2014-03-05 (×2): 100 mg via ORAL
  Filled 2014-03-04 (×2): qty 2

## 2014-03-04 MED ORDER — IOHEXOL 350 MG/ML SOLN
100.0000 mL | Freq: Once | INTRAVENOUS | Status: AC | PRN
  Administered 2014-03-04: 100 mL via INTRAVENOUS

## 2014-03-04 MED ORDER — ASPIRIN 81 MG PO TABS
81.0000 mg | ORAL_TABLET | Freq: Every day | ORAL | Status: DC
  Administered 2014-03-04: 81 mg via ORAL
  Filled 2014-03-04 (×2): qty 1

## 2014-03-04 MED ORDER — METOPROLOL SUCCINATE ER 50 MG PO TB24
50.0000 mg | ORAL_TABLET | Freq: Every day | ORAL | Status: DC
  Administered 2014-03-04 – 2014-03-05 (×2): 50 mg via ORAL
  Filled 2014-03-04 (×2): qty 1

## 2014-03-04 MED ORDER — MULTIVITAMINS PO TABS: 1.0000 | ORAL_TABLET | Freq: Every day | ORAL | Status: DC

## 2014-03-04 MED ORDER — AMLODIPINE BESYLATE 10 MG PO TABS
10.0000 mg | ORAL_TABLET | Freq: Every day | ORAL | Status: DC
  Administered 2014-03-04 – 2014-03-05 (×2): 10 mg via ORAL
  Filled 2014-03-04 (×2): qty 1

## 2014-03-04 MED ORDER — ALPRAZOLAM 0.25 MG PO TABS
0.2500 mg | ORAL_TABLET | Freq: Three times a day (TID) | ORAL | Status: DC | PRN
  Administered 2014-03-04: 0.25 mg via ORAL
  Filled 2014-03-04: qty 1

## 2014-03-04 MED ORDER — NITROGLYCERIN 0.4 MG SL SUBL: 0.4000 mg | SUBLINGUAL_TABLET | SUBLINGUAL | Status: DC | PRN

## 2014-03-04 MED ORDER — ONDANSETRON HCL 4 MG/2ML IJ SOLN: 4.0000 mg | Freq: Four times a day (QID) | INTRAMUSCULAR | Status: DC | PRN

## 2014-03-04 MED ORDER — HEPARIN SODIUM (PORCINE) 5000 UNIT/ML IJ SOLN
5000.0000 [IU] | Freq: Three times a day (TID) | INTRAMUSCULAR | Status: DC
  Administered 2014-03-04 – 2014-03-05 (×2): 5000 [IU] via SUBCUTANEOUS
  Filled 2014-03-04 (×4): qty 1

## 2014-03-04 NOTE — ED Notes (Signed)
Patient transported to CT 

## 2014-03-04 NOTE — ED Notes (Signed)
Patient transported to X-ray 

## 2014-03-04 NOTE — ED Notes (Signed)
To ED via [facility] -- with c/o chest pain intermittently for approx 5 days-- none at present--- pt took ASA 325mg  at home , no NTG given-- pain free enroute. Hx of AAA--

## 2014-03-04 NOTE — ED Notes (Signed)
Cardiology at bedside.

## 2014-03-04 NOTE — ED Notes (Signed)
To xray for CT

## 2014-03-04 NOTE — ED Notes (Signed)
Pt has an appt in [place] for dentures, concerned about missing it-- stressed to talk to cardiologist regarding this appt.

## 2014-03-04 NOTE — H&P (Signed)
Patient ID: Eric Carpenter MRN: [id number], DOB/AGE: 78-Jun-1928   Admit date: 03/04/2014   Primary Physician: Carlus Pavlov, MD Primary Cardiologist: P. Martinique, MD   Pt. Profile:  80 y/o male with a h/o symptomatic PAC's/PVC's, carotid dzs s/p remote CEA, and AAA, who presented to the ER today with chest pain.  Problem List  Past Medical History  Diagnosis Date  . Hypertension   . Hyperlipidemia   . PVC's (premature ventricular contractions)   . Premature atrial contraction   . GERD (gastroesophageal reflux disease)   . History of TIAs     Questionable CVA in 1989  . Abdominal aortic aneurysm     a. 03/2013 Abd Duplex: AAA 4.2 x 4.2 cm.  . Prostate cancer   . History of measles, mumps, or rubella   . Status post carotid endarterectomy   . Carotid artery occlusion     a. s/p R CEA 1998;  b. 03/2013 U/S: RICA < 63%, LICA 78-58%.  . H/O cardiovascular stress test     a. Pt reports a h/o pharmacologic stress test - cannot find records.    Past Surgical History  Procedure Laterality Date  . Appendectomy    . Tonsillectomy    . Hernia repair    . Prostate surgery    . Total knee arthroplasty      right  . Eye surgery  July 2012    Cataract Bilateral eye  . Eye surgery  Dec. 18, 2013    Right lower eyelid surgery  . Carotid endarterectomy Right 1998    cea    Allergies  Allergies  Allergen Reactions  . Ciprofloxacin Anxiety  . Cleocin [Clindamycin Hcl] Anxiety  . Neomycin Other (See Comments)    other  . Tobradex [Tobramycin-Dexamethasone]     Opthalmic-burning    HPI  80 y/o male with the above complex problem list.  He has a h/o CVD and is s/p R CEA in 1998.  He also has a h/o AAA.  He has been followed by vascular surgery with last abd u/s being stable in 03/2013 (4.2 x 4.2 cm AAA).  He is also followed by Dr. Martinique for a h/o symptomatic PAC's and PVC's, which have been managed with BB therapy.  He says that at some point in the past few years, he has  had a pharmacologic stress test, which was normal. Patient is very active at home, walking 2.5 miles 5 days per week for many years now. He lives in [place] with his significant other who suffers from dementia. He is her primary care provider and has been very stressed related to her decline in health.  Over the past 1-2 weeks, he has been experiencing intermittent rest and exertional sharp and fleeting pain which moves from his mid abdomen to his lower chest without associated symptoms, lasting less than 2 seconds, and resolving spontaneously. This has happened several times over the past week, usually at rest, but did happen on one occasion while walking. He has never had anything like this before. He has not had any change in his exercise tolerance or DOE.    This morning, he awoke suddenly at approximately 4 AM with recurrent sharp and fleeting abdominal and chest pain. This time however, he noted it in his back as well. Symptoms lasted approximately 2 seconds. Because he has a history of abdominal aortic aneurysm, he was acutely aware that any pain in his back could represent a complication with his aneurysm. As result,  he presented to the ED for evaluation. Here, ECG is nonacute. Initial troponin is normal. CT angiogram of the chest abdomen and pelvis was performed and showed no evidence of acute dissection. Significant calcified plaque throughout the coronary arteries was noted and diameter of infrarenal abdominal aortic aneurysm has increased from 4.1 cm to 5.1 cm since 2008.  Moderate stenosis at the origins of the superior mesenteric artery and left renal artery were also noted.  His blood pressure in the emergency room has been variable between 130s and 170s. Patient said his blood pressure on a physician appointment at [facility] last week was in the 190s. He does not routinely check his blood pressure at home. He currently denies any chest pain, back pain, palpitations, or dyspnea.  Home  Medications  Prior to Admission medications   Medication Sig Start Date End Date Taking? Authorizing Provider  ALPRAZolam (XANAX) 0.25 MG tablet Take 0.25 mg by mouth as needed.     Yes Historical Provider, MD  amLODipine (NORVASC) 10 MG tablet Take 1 tablet by mouth Daily. 10/16/10  Yes Historical Provider, MD  aspirin 81 MG tablet Take 81 mg by mouth daily.     Yes Historical Provider, MD  atorvastatin (LIPITOR) 40 MG tablet Take 40 mg by mouth daily.     Yes Historical Provider, MD  losartan (COZAAR) 50 MG tablet TAKE 1 TABLET BY MOUTH ONCE DAILY   Yes Peter M Martinique, MD  metoprolol succinate (TOPROL-XL) 50 MG 24 hr tablet TAKE ONE (1) TABLET EACH DAY   Yes Peter M Martinique, MD  multivitamin [facility]) per tablet Take 1 tablet by mouth daily. One-A-Day for Women   Yes Historical Provider, MD  Psyllium (METAMUCIL PO) Take by mouth.   Yes Historical Provider, MD    Family History  Family History  Problem Relation Age of Onset  . Stroke Father   . Heart failure Mother     Social History  History   Social History  . Marital Status: Divorced    Spouse Name: N/A    Number of Children: 0  . Years of Education: N/A   Occupational History  . department manager    Social History Main Topics  . Smoking status: Former Smoker    Quit date: 04/14/1979  . Smokeless tobacco: Never Used  . Alcohol Use: No  . Drug Use: No  . Sexual Activity: Not on file   Other Topics Concern  . Not on file   Social History Narrative   Patient lives in [place] with his significant other. She suffers from dementia and he is her primary care provider. He walks 2.5 miles 5 days per week.     Review of Systems General:  No chills, fever, night sweats or weight changes.  Cardiovascular:  +++ sharp, fleeting abdominal and chest pain as outlined above.  No dyspnea on exertion, edema, orthopnea, palpitations, paroxysmal nocturnal dyspnea. Dermatological: No rash, lesions/masses Respiratory: No cough,  dyspnea Urologic: No hematuria, dysuria Abdominal:   No nausea, vomiting, diarrhea, bright red blood per rectum, melena, or hematemesis Neurologic:  No visual changes, wkns, changes in mental status. All other systems reviewed and are otherwise negative except as noted above.  Physical Exam  Blood pressure 138/85, pulse 67, temperature 97.4 F (36.3 C), temperature source Oral, resp. rate 14, height 5' 9.5" (1.765 m), weight 167 lb (75.751 kg), SpO2 95 %.  General: Pleasant, NAD Psych: Normal affect. Neuro: Alert and oriented X 3. Moves all extremities spontaneously. HEENT: Normal  Neck: Supple without bruits or JVD.  Old CEA scar on right. Lungs:  Resp regular and unlabored, CTA. Heart: RRR no s3, s4, or murmurs. Abdomen: Soft, non-tender, non-distended, BS + x 4.  Extremities: No clubbing, cyanosis or edema. DP/PT/Radials 2+ and equal bilaterally.  Labs  Troponin [facility] Test)  Recent Labs  03/04/14 1024  TROPIPOC 0.00   No results for input(s): CKTOTAL, CKMB, TROPONINI in the last 72 hours. Lab Results  Component Value Date   WBC 7.7 03/04/2014   HGB 16.0 03/04/2014   HCT 47.2 03/04/2014   MCV 89.9 03/04/2014   PLT 266 03/04/2014     Recent Labs Lab 03/04/14 0751  NA 139  K 4.4  CL 99  CO2 24  BUN 17  CREATININE 0.88  CALCIUM 9.3  GLUCOSE 105*   Radiology/Studies  Dg Chest 2 View  03/04/2014   CLINICAL DATA:  Chest pain left-sided radiating to back beginning 6 days ago.  EXAM: CHEST  2 VIEW  COMPARISON:  06/19/2009 IMPRESSION: No active cardiopulmonary disease.  Suggestion of mild emphysematous disease.   Electronically Signed   By: Marin Olp M.D.   On: 03/04/2014 08:32   Ct Cta Chest/Abd/pel W/cm &/or W/o Cm  03/04/2014   CLINICAL DATA:  Sharp back pain and chest pain. Known abdominal aortic aneurysm.  EXAM: CT ANGIOGRAPHY CHEST, ABDOMEN AND PELVIS  IMPRESSION: 1. No evidence of acute aortic dissection. 2. COPD without acute pulmonary process.  3. Significant calcified plaque throughout the coronary arteries in a 3 vessel distribution and also likely at the level of the left main coronary artery. 4. Enlargement of infrarenal abdominal aortic aneurysm since prior CTA in 2008. Maximal aneurysm diameter has increased from 4.1 cm to 5.1 cm. No evidence of dissection or leak at the level of the abdominal aortic aneurysm. 5. Moderate stenosis at the origins of the superior mesenteric artery and left renal artery. 6. Aneurysmal disease involving distal common iliac arteries bilaterally and the distal right common femoral artery. 7. Hepatic steatosis with probable early cirrhosis.   Electronically Signed   By: Aletta Edouard M.D.   On: 03/04/2014 09:49   ECG  RSR, 70, 1st deg AVB, PAC's.  ASSESSMENT AND PLAN  1.  Abdominal and midsternal chest pain:  Pt presents with a 7+ day h/o intermittent, sharp, fleeting, rest & exertional abdominal and chest pain w/o associated Ss.  He had a particularly bad episode this AM that radiated into his back, which concerned him 2/2 h/o AAA.  Eval here notable for CTA, which shows no evidence of dissection, though AAA size increased from 4.1 cm on CTA in 2008 to 5.1 cm this morning (4.2 x 4.2 by duplex last December).  Chest pain Ss atypical for angina, especially in light of excellent exercise tolerance (walks 2.5 mi/day).  That said, he does have a h/o carotid dzs and has been noted to have coronary calcified plaque in a 3 vessel distribution on CTA today.   Recommend formal observation, r/o, and stress testing tomorrow morning to r/o ischemia.  Will need BP med adjustments given trend toward hypertension and AAA.  Cont asa, statin, bb, arb.  2.  AAA:  As above, 5.1 cm maximum diameter on CTA this AM.  Last measured @ 4.2 x 4.2 on duplex 03/2013.  Focus on BP control.  Will consult Dr. Donnetta Hutching tomorrow. Not at 5.5cm but increased almost 1cm in diameter in 11 months.   3.  HTN:  Cont home meds.  Titrate losartan to  100mg  daily.  Cont bb/ccb.  4.  HL:  Cont statin.  Lipids and lft's are followed by his PCP as an outpt.  5.  PAC's/PVC's: asymptomatic.  Cont BB.   Signed, Murray Hodgkins, NP 03/04/2014, 12:21 PM  Personally seen and examined. Agree with above. Atypical CP/epigastric pain. He was most concerned about AAA. Exam benign, RRR, lungs are clear, abd soft NT CT scan personally viewed. Dense 3v cors calcium.  -observe -NUC stress in AM -Consult Dr. Donnetta Hutching, vascular in AM -control BP  SKAINS, MARK, MD

## 2014-03-04 NOTE — Progress Notes (Signed)
Pt received into room 2w20, pt placed on tele, pt in chair, pt states after moving around his "heart feels like its beating fast", tele NSR 72, pt oriented to room and call bell, will continue to monitor closely Rickard Rhymes, RN

## 2014-03-04 NOTE — ED Notes (Signed)
Report called to 2 west.

## 2014-03-04 NOTE — ED Provider Notes (Signed)
CSN: [id number]     Arrival date & time 03/04/14  3790 History   First MD Initiated Contact with Patient 03/04/14 [phone]     Chief Complaint  Patient presents with  . Chest Pain     (Consider location/radiation/quality/duration/timing/severity/associated sxs/prior Treatment) HPI Eric Carpenter is a(n) 80 y.o. male who presents to the ED with cc of abd pain/cp. He has a history of htn, hyperlipidemia, s/p carotid endometrectomy and AAA last measuring 4.2 cm (03/2013). The patient c/o pain that began 1 week ago. He states it is sharp, severe, begins in his lower back, radiates through his abdomen and up into his left chest. He states that it has been increasing in frequency and intensity.  He took 325 of asa this morning and is currently pain free. Denies fevers, chills, myalgias, arthralgias. Denies DOE, SOB, chest tightness or pressure, radiation to left arm, jaw or back, or diaphoresis. Denies dysuria, flank pain, suprapubic pain, frequency, urgency, or hematuria. Denies headaches, light headedness, weakness, visual disturbances. Denies nausea, vomiting, diarrhea or constipation.     Past Medical History  Diagnosis Date  . Hypertension   . Hyperlipidemia   . PVC's (premature ventricular contractions)   . Premature atrial contraction   . GERD (gastroesophageal reflux disease)   . History of TIAs     Questionable CVA in 1989  . Abdominal aortic aneurysm   . Prostate cancer   . History of measles, mumps, or rubella   . Status post carotid endarterectomy   . Carotid artery occlusion    Past Surgical History  Procedure Laterality Date  . Appendectomy    . Tonsillectomy    . Hernia repair    . Prostate surgery    . Total knee arthroplasty      right  . Eye surgery  July 2012    Cataract Bilateral eye  . Eye surgery  Dec. 18, 2013    Right lower eyelid surgery  . Carotid endarterectomy Right 1998    cea   Family History  Problem Relation Age of Onset  . Stroke Father   .  Heart failure Mother    History  Substance Use Topics  . Smoking status: Former Smoker    Quit date: 04/14/1979  . Smokeless tobacco: Never Used  . Alcohol Use: No    Review of Systems  Ten systems reviewed and are negative for acute change, except as noted in the HPI.    Allergies  Ciprofloxacin; Cleocin; Neomycin; and Tobradex  Home Medications   Prior to Admission medications   Medication Sig Start Date End Date Taking? Authorizing Provider  ALPRAZolam (XANAX) 0.25 MG tablet Take 0.25 mg by mouth as needed.     Yes Historical Provider, MD  amLODipine (NORVASC) 10 MG tablet Take 1 tablet by mouth Daily. 10/16/10  Yes Historical Provider, MD  aspirin 81 MG tablet Take 81 mg by mouth daily.     Yes Historical Provider, MD  atorvastatin (LIPITOR) 40 MG tablet Take 40 mg by mouth daily.     Yes Historical Provider, MD  losartan (COZAAR) 50 MG tablet TAKE 1 TABLET BY MOUTH ONCE DAILY   Yes Peter M Martinique, MD  metoprolol succinate (TOPROL-XL) 50 MG 24 hr tablet TAKE ONE (1) TABLET EACH DAY   Yes Peter M Martinique, MD  multivitamin [facility]) per tablet Take 1 tablet by mouth daily. One-A-Day for Women   Yes Historical Provider, MD  Psyllium (METAMUCIL PO) Take by mouth.   Yes Historical Provider,  MD   BP 168/81 mmHg  Pulse 67  Temp(Src) 97.8 F (36.6 C) (Oral)  Resp 14  Ht 5' 9.5" (1.765 m)  Wt 167 lb (75.751 kg)  BMI 24.32 kg/m2  SpO2 97% Physical Exam  Constitutional: He appears well-developed and well-nourished. No distress.  HENT:  Head: Normocephalic and atraumatic.  Eyes: Conjunctivae are normal. No scleral icterus.  Neck: Normal range of motion. Neck supple.  Cardiovascular: Normal rate, regular rhythm and normal heart sounds.   Pulmonary/Chest: Effort normal and breath sounds normal. No respiratory distress.  Abdominal: Soft. He exhibits no distension and no mass. There is no tenderness. There is no guarding.  Wide AA on palpation measuring about 4 cm just above the  umbilicus  Musculoskeletal: He exhibits no edema.  Neurological: He is alert.  Skin: Skin is warm and dry. He is not diaphoretic.  Psychiatric: His behavior is normal.  Nursing note and vitals reviewed.   ED Course  Procedures (including critical care time) Labs Review Labs Reviewed  BASIC METABOLIC PANEL - Abnormal; Notable for the following:    Glucose, Bld 105 (*)    GFR calc non Af Amer 76 (*)    GFR calc Af Amer 88 (*)    Anion gap 16 (*)    All other components within normal limits  CBC  I-STAT TROPOININ, ED  I-STAT TROPOININ, ED    Imaging Review No results found.   EKG Interpretation   Date/Time:  Sunday March 04 2014 07:27:42 EST Ventricular Rate:  70 PR Interval:  255 QRS Duration: 89 QT Interval:  412 QTC Calculation: 445 R Axis:   -4 Text Interpretation:  Sinus rhythm Atrial premature complex Prolonged PR  interval Since previous tracing pacs are new Confirmed by Canary Brim  MD,  [place] [id number]) on 03/04/2014 9:24:33 AM      MDM   Final diagnoses:  Abdominal pain   Patient sxs warrant evaluation of  His aorta. I have discussed the case with Dr. Canary Brim who is in agreement.  Last CTA 2008. Last [place] 2014 with AAA measuring 4.2cm   10:23 AM BP 127/69 mmHg  Pulse 58  Temp(Src) 97.4 F (36.3 C) (Oral)  Resp 13  Ht 5' 9.5" (1.765 m)  Wt 167 lb (75.751 kg)  BMI 24.32 kg/m2  SpO2 95% Patient with a 1 cm interval increase in his AAA up to 5.1cm. We have consulted with cardiology to see the patient.    Patient will be admitted for Cp r/o. Troponin negative. EKG without ischemia.   Margarita Mail, PA-C 03/08/14 [place], MD 03/12/14 [phone]

## 2014-03-05 ENCOUNTER — Observation Stay (HOSPITAL_COMMUNITY): Payer: Medicare Other

## 2014-03-05 ENCOUNTER — Encounter (HOSPITAL_COMMUNITY): Payer: Self-pay | Admitting: Physician Assistant

## 2014-03-05 ENCOUNTER — Telehealth: Payer: Self-pay | Admitting: Vascular Surgery

## 2014-03-05 DIAGNOSIS — R079 Chest pain, unspecified: Secondary | ICD-10-CM

## 2014-03-05 DIAGNOSIS — I2584 Coronary atherosclerosis due to calcified coronary lesion: Secondary | ICD-10-CM

## 2014-03-05 DIAGNOSIS — I739 Peripheral vascular disease, unspecified: Secondary | ICD-10-CM | POA: Diagnosis not present

## 2014-03-05 DIAGNOSIS — R072 Precordial pain: Principal | ICD-10-CM

## 2014-03-05 DIAGNOSIS — I2510 Atherosclerotic heart disease of native coronary artery without angina pectoris: Secondary | ICD-10-CM

## 2014-03-05 DIAGNOSIS — R0789 Other chest pain: Secondary | ICD-10-CM | POA: Diagnosis not present

## 2014-03-05 DIAGNOSIS — K760 Fatty (change of) liver, not elsewhere classified: Secondary | ICD-10-CM | POA: Diagnosis present

## 2014-03-05 LAB — LIPID PANEL
Cholesterol: 175 mg/dL (ref 0–200)
HDL: 38 mg/dL — ABNORMAL LOW (ref >39)
LDL CALC: 110 mg/dL — AB (ref 0–99)
Total CHOL/HDL Ratio: 4.6 RATIO
Triglycerides: 135 mg/dL (ref <150)
VLDL: 27 mg/dL (ref 0–40)

## 2014-03-05 LAB — HEPATIC FUNCTION PANEL
ALBUMIN: 3.8 g/dL (ref 3.5–5.2)
ALK PHOS: 122 U/L — AB (ref 39–117)
ALT: 15 U/L (ref 0–53)
AST: 20 U/L (ref 0–37)
Bilirubin, Direct: <0.2 mg/dL (ref 0.0–0.3)
TOTAL PROTEIN: 6.5 g/dL (ref 6.0–8.3)
Total Bilirubin: 0.6 mg/dL (ref 0.3–1.2)

## 2014-03-05 LAB — TROPONIN I: Troponin I: <0.3 ng/mL (ref <0.30)

## 2014-03-05 MED ORDER — REGADENOSON 0.4 MG/5ML IV SOLN
INTRAVENOUS | Status: AC
  Administered 2014-03-05: 0.4 mg via INTRAVENOUS
  Filled 2014-03-05: qty 5

## 2014-03-05 MED ORDER — TECHNETIUM TC 99M SESTAMIBI GENERIC - CARDIOLITE
30.0000 | Freq: Once | INTRAVENOUS | Status: AC | PRN
  Administered 2014-03-05: 30 via INTRAVENOUS

## 2014-03-05 MED ORDER — ASPIRIN EC 81 MG PO TBEC
81.0000 mg | DELAYED_RELEASE_TABLET | Freq: Every day | ORAL | Status: DC
  Administered 2014-03-05: 81 mg via ORAL
  Filled 2014-03-05: qty 1

## 2014-03-05 MED ORDER — TECHNETIUM TC 99M SESTAMIBI GENERIC - CARDIOLITE
10.0000 | Freq: Once | INTRAVENOUS | Status: AC | PRN
  Administered 2014-03-05: 10 via INTRAVENOUS

## 2014-03-05 MED ORDER — LOSARTAN POTASSIUM 100 MG PO TABS: 100.0000 mg | ORAL_TABLET | Freq: Every day | ORAL | Status: DC

## 2014-03-05 NOTE — Progress Notes (Signed)
Patient: Eric Carpenter / Admit Date: 03/04/2014 / Date of Encounter: 03/05/2014, 9:14 AM  Subjective: Feeling much better.No CP or SOB. Tolerated Lexiscan well without significant symptoms.  Objective: Telemetry: NSR occasional PAC, PVC, one blocked PAC Physical Exam: Blood pressure 116/71, pulse 56, temperature 97.7 F (36.5 C), temperature source Oral, resp. rate 18, height 5' 9.5" (1.765 m), weight 167 lb (75.751 kg), SpO2 93 %.   General: Well developed, well nourished, in no acute distress. Head: Normocephalic, atraumatic, sclera non-icteric, no xanthomas, nares are without discharge. Neck: JVP not elevated. Lungs: Clear bilaterally to auscultation without wheezes, rales, or rhonchi. Breathing is unlabored. Heart: RRR S1 S2 without murmurs, rubs, or gallops.  Abdomen: Soft, non-tender, non-distended with normoactive bowel sounds. No rebound/guarding. Extremities: No clubbing or cyanosis. No edema. Distal pedal pulses in tact, equal bilaterally. Neuro: Alert and oriented X 3. Moves all extremities spontaneously. Psych:  Responds to questions appropriately with a normal affect.  Intake/Output Summary (Last 24 hours) at 03/05/14 0914 Last data filed at 03/04/14 0948  Gross per 24 hour  Intake      0 ml  Output    350 ml  Net   -350 ml   Inpatient Medications:  . amLODipine  10 mg Oral Daily  . aspirin EC  81 mg Oral Daily  . atorvastatin  40 mg Oral Daily  . heparin  5,000 Units Subcutaneous 3 times per day  . losartan  100 mg Oral Daily  . metoprolol succinate  50 mg Oral Daily  . multivitamin with minerals  1 tablet Oral Daily   Infusions:    Labs:  Recent Labs  03/04/14 0751  NA 139  K 4.4  CL 99  CO2 24  GLUCOSE 105*  BUN 17  CREATININE 0.88  CALCIUM 9.3    Recent Labs  03/04/14 0751  WBC 7.7  HGB 16.0  HCT 47.2  MCV 89.9  PLT 266    Recent Labs  03/04/14 1530 03/04/14 1954 03/05/14 0225  TROPONINI <0.30 <0.30 <0.30   Invalid  input(s): POCBNP No results for input(s): HGBA1C in the last 72 hours.   Radiology/Studies:  Dg Chest 2 View  03/04/2014   CLINICAL DATA:  Chest pain left-sided radiating to back beginning 6 days ago.  EXAM: CHEST  2 VIEW  COMPARISON:  06/19/2009  FINDINGS: Lungs are adequately inflated and otherwise clear. Suggestion of mild emphysematous disease. Cardiomediastinal silhouette and remainder of the exam is unchanged.  IMPRESSION: No active cardiopulmonary disease.  Suggestion of mild emphysematous disease.   Electronically Signed   By: Marin Olp M.D.   On: 03/04/2014 08:32    Ct Angio Chest Aorta W/cm &/or Wo/cm Ct Cta Abd/pel W/cm &/or W/o Cm  03/04/2014   CLINICAL DATA:  Sharp back pain and chest pain. Known abdominal aortic aneurysm.    IMPRESSION: 1. No evidence of acute aortic dissection. 2. COPD without acute pulmonary process. 3. Significant calcified plaque throughout the coronary arteries in a 3 vessel distribution and also likely at the level of the left main coronary artery. 4. Enlargement of infrarenal abdominal aortic aneurysm since prior CTA in 2008. Maximal aneurysm diameter has increased from 4.1 cm to 5.1 cm. No evidence of dissection or leak at the level of the abdominal aortic aneurysm. 5. Moderate stenosis at the origins of the superior mesenteric artery and left renal artery. 6. Aneurysmal disease involving distal common iliac arteries bilaterally and the distal right common femoral artery. 7. Hepatic steatosis with  probable early cirrhosis.   Electronically Signed   By: Aletta Edouard M.D.   On: 03/04/2014 09:49     Assessment and Plan   1. Precordial chest pain - atypical, but + coronary artery calcification by CTA  2. Vascular disease with R CEA 1998, AAA - CTA: no dissection; increase in aneurysm to 5.1cm, mod stenosis at origins of SMA and L renal artery 3. H/o symptomatic PVCs and PACs 4. HTN 5. HLD - LDL 110 6. Hepatic steatosis with probable early cirrhosis on  CT  R/o for MI. Await results of nuclear stress test. If negative would likely consider for discharge home and outpatient followup, as well as continued med rx for coronary calcification.  He has f/u with vascular in Epic on 12/22. Already has appt with Dr. Martinique 03/23/14 at 2:15pm.  Signed, Melina Copa PA-C    The patient was seen, examined and discussed with Melina Copa, PA-C and I agree with the above.   80 year old male with who presented with atypical chest pain, no prior cath, coronary calcifications by CTA. H/o carotid endarterectomy, also has AAA increasing in diameter, but no dissection, also left renal artery stenosis and moderate SMA stenosis. He has good BP control and is on moderate dose of atorvastatin. Post nuclear stress test, asymptomatic now, awaiting results. If normal, discharge home.   Dorothy Spark 03/05/2014

## 2014-03-05 NOTE — Telephone Encounter (Signed)
Called by Melina Copa, cardiology PA.  Pt admitted and worked up for chest pain.  Noted to have increase in size of AAA by 1 cm over the past year 4.1 to 5.1 cm.  He will see Dr Donnetta Hutching next week Dec 1 1215pm to consider repair.  Ruta Hinds, MD [facility] Office: [phone] Pager: [phone]

## 2014-03-05 NOTE — Progress Notes (Signed)
UR completed 

## 2014-03-05 NOTE — Progress Notes (Signed)
Discharge education, medication, follow-up appts, and prescriptions reviewed with pt and family, both stated understanding and that they had no questions, IV and tele removed Rickard Rhymes, RN

## 2014-03-05 NOTE — Discharge Summary (Signed)
Discharge Summary   Patient ID: Eric Carpenter MRN: [id number], DOB/AGE: Feb 23, 1927 80 y.o. Admit date: 03/04/2014 D/C date:     03/05/2014  Primary Care Provider: Carlus Pavlov, MD Primary Cardiologist: Dr. Martinique  Primary Discharge Diagnoses:  1. Midsternal chest pain - atypical with nonischemic nuclear stress test 2. Coronary calcification on CT 3. PVD with R CEA 1998, AAA - CTA: no dissection; increase in aneurysm to 5.1cm, mod stenosis at origins of SMA and L renal artery 4. H/o symptomatic PVCs and PACs 5. HTN 6. HLD - LDL 110 7. Hepatic steatosis with probable early cirrhosis on CT  Secondary Discharge Diagnoses:  1. GERD 2. Hx of TIAs and questionable CVA in 1989 3. Prostate cancer 4. History of measles, mumps, or rubella  Hospital Course: Mr. Strole is an 80 y/o male with the above complex problem list to include PVD as above, symptomatic PACs/PVCs (followed by Dr. Martinique), HTN, HLD, and GERD. He is very active at home, walking 2.5 miles 5 days per week for many years now. He lives in [place] with his significant other who suffers from dementia. He is her primary care provider and has been very stressed related to her decline in health. Over the past 1-2 weeks, he has been experiencing intermittent rest and exertional sharp and fleeting pain which moves from his mid abdomen to his lower chest without associated symptoms, lasting less than 2 seconds, and resolving spontaneously. This has happened several times over the past week, usually at rest, but did happen on one occasion while walking. He has never had anything like this before. He has not had any change in his exercise tolerance or DOE.On the morning of admission he awoke with recurrent symptoms including to his back lasting approximately 2 seconds. Because he has a history of abdominal aortic aneurysm, he was acutely aware that any pain in his back could represent a complication with his aneurysm so he came to the ER.  ECG was nonacute. Initial troponin was normal. CT angiogram of the chest abdomen and pelvis was performed and showed no evidence of acute dissection. There was significant calcified plaque throughout the coronary arteries was noted and diameter of infrarenal abdominal aortic aneurysm has increased from 4.1 cm to 5.1 cm since 2008; moderate stenosis at the origins of the superior mesenteric artery and left renal artery were also noted.This also showed hepatic steatosis with probable early cirrhosis on CT. His blood pressure in the emergency room was variable between 130s and 170s.  Symptoms were felt atypical. However, given h/o vascular disease and coronary calcification on CT he was admitted for observation. Troponins remained negative. He underwent Lexiscan nuclear stress testing without adverse symptoms. Results showed normal LV size and function with EF 79%, inferior infarction with preserved EF versus diaphragmatic attenuation given normal inferior wall motion, no evidence of ischemia. The patient feels well today without further complaint. His blood pressure has remained controlled in the hospital today. Dr. Meda Coffee has seen and examined the patient today and feels he is stable for discharge.   I discussed his CT scan results with Dr. Oneida Alar, on call for Dr. Donnetta Hutching. He has recommended earlier outpatient followup than previously scheduled on 04/03/14 - the patient will be seen by Dr. Donnetta Hutching 12/1 at 12:15pm. We appreciate their assistance. I have notified the patient of the change in his appointment as well.  Of note during this admisson, losartan was titrated to 100mg  daily. Might consider BMET at outpatient visit with Dr. Martinique as previously  arranged 03/23/14. He was advised to monitor BP at home and call if running higher than 130/80. He plans to buy a new BP cuff.  Discharge Vitals: Blood pressure 102/61, pulse 61, temperature 98.4 F (36.9 C), temperature source Oral, resp. rate 20, height 5' 9.5"  (1.765 m), weight 167 lb (75.751 kg), SpO2 95 %.  Labs: Lab Results  Component Value Date   WBC 7.7 03/04/2014   HGB 16.0 03/04/2014   HCT 47.2 03/04/2014   MCV 89.9 03/04/2014   PLT 266 03/04/2014     Recent Labs Lab 03/04/14 0751 03/05/14 0225  NA 139  --   K 4.4  --   CL 99  --   CO2 24  --   BUN 17  --   CREATININE 0.88  --   CALCIUM 9.3  --   PROT  --  6.5  BILITOT  --  0.6  ALKPHOS  --  122*  ALT  --  15  AST  --  20  GLUCOSE 105*  --     Recent Labs  03/04/14 1530 03/04/14 1954 03/05/14 0225  TROPONINI <0.30 <0.30 <0.30   Lab Results  Component Value Date   CHOL 175 03/05/2014   HDL 38* 03/05/2014   LDLCALC 110* 03/05/2014   TRIG 135 03/05/2014     Diagnostic Studies/Procedures   Dg Chest 2 View  03/04/2014   CLINICAL DATA:  Chest pain left-sided radiating to back beginning 6 days ago.  EXAM: CHEST  2 VIEW  COMPARISON:  06/19/2009  FINDINGS: Lungs are adequately inflated and otherwise clear. Suggestion of mild emphysematous disease. Cardiomediastinal silhouette and remainder of the exam is unchanged.  IMPRESSION: No active cardiopulmonary disease.  Suggestion of mild emphysematous disease.   Electronically Signed   By: Marin Olp M.D.   On: 03/04/2014 08:32   Nm Myocar Multi W/spect W/wall Motion / Ef  03/05/2014   CLINICAL DATA:  Chest pain  EXAM: Lexiscan Myovue  TECHNIQUE: The patient received IV Lexiscan .4mg  over 15 seconds. 33.0 mCi of Technetium 7m Sestamibi injected at 30 seconds. Quantitative SPECT images were obtained in the vertical, horizontal and short axis planes after a 45 minute delay. Rest images were obtained with similar planes and delay using 10.2 mCi of Technetium 30m Sestamibi.  FINDINGS: ECG:  No ECG changes with Lexiscan stress.  Quantitiative Gated SPECT EF:  79% with normal wall motion.  Perfusion Images: There was a medium-sized, severe basal to mid inferoseptal, inferior, and inferolateral perfusion defect with stress. A  similar defect was seen at rest. There was minimal reversibility.  IMPRESSION: 1.  Normal LV size and systolic function.  2. Fixed medium-sized, severe basal to mid inferoseptal/inferior/inferolateral perfusion defect. This suggested possible infarction with no significant ischemia. However, normal wall motion in the inferior wall raises the possibility of diaphragmatic attenuation.  3. Low risk study overall. Inferior infarction with preserved EF versus diaphragmatic attenuation given normal inferior wall motion.  Dalton Mclean   Electronically Signed   By: Loralie Champagne M.D.   On: 03/05/2014 15:03   Ct Angio Chest Aorta W/cm &/or Wo/cm  03/04/2014   CLINICAL DATA:  Sharp back pain and chest pain. Known abdominal aortic aneurysm.  EXAM: CT ANGIOGRAPHY CHEST, ABDOMEN AND PELVIS  TECHNIQUE: Multidetector CT imaging through the chest, abdomen and pelvis was performed using the standard protocol during bolus administration of intravenous contrast. Multiplanar reconstructed images and MIPs were obtained and reviewed to evaluate the vascular anatomy.  CONTRAST:  186mL OMNIPAQUE IOHEXOL 350 MG/ML SOLN  COMPARISON:  CTA of the abdomen and pelvis dated 01/05/2007  FINDINGS: CTA CHEST FINDINGS  The thoracic aorta is well opacified and shows normal caliber without evidence of aneurysmal disease. No intramural hemorrhage or aortic dissection is identified. Proximal great vessels show normal branching anatomy and no evidence of significant occlusive disease or aneurysm. Calcified plaque is present throughout much of the thoracic aorta without evidence of penetrating ulcer disease.  The pulmonary arteries are also very well opacified on the study. The study is adequate to exclude pulmonary embolism.  The heart size is normal. Calcified plaque is identified in a 3 vessel distribution within the RCA, LAD and left circumflex arteries. There also may be calcified plaque at the level of the left main coronary artery.  Lungs show  evidence of COPD with mild emphysematous changes present as well as scattered areas of parenchymal scarring. No masses or enlarged lymph nodes are seen. No evidence of pulmonary edema, airspace consolidation or pneumothorax. Visualized airways are normally patent. Bony structures are unremarkable.  Review of the MIP images confirms the above findings.  CTA ABDOMEN AND PELVIS FINDINGS  The abdominal aorta shows infrarenal aneurysmal disease which has enlarged since prior CTA evaluation in 2008. Maximum AP diameter of the aneurysm is now 5.1 cm compared to 4.1 cm and transverse width is 4.5 cm. Infrarenal neck length is roughly 3 cm with the neck showing a oblong dimensions of 2.5 cm in with and 3.2 cm in AP diameter. Total fusiform aneurysm length is approximately 7.5 cm with the aneurysmal dilatation terminating approximately 3 cm proximal to the aortic bifurcation. No associated dissection or evidence of aneurysm leak. Aneurysm contains eccentric mural thrombus on the left and posterior aspects of the aortic lumen.  Calcified plaque at the origin of the celiac axis causes approximately 25-30% stenosis. Calcified plaque at the origin of the superior mesenteric artery causes approximately 50- 60% narrowing. Calcified plaque at the origin of a single right renal artery causes approximately 30% narrowing. Calcified plaque at the origin of a single left renal artery causes approximately 50-60% narrowing. The inferior mesenteric artery is open with the fusiform aneurysm terminating chest above the IMA origin.  Both common iliac arteries are very tortuous. Distal segments of the common iliac arteries show aneurysmal disease bilaterally. Maximal diameter of the distal right common iliac artery is 18 mm and maximum diameter of the distal left common iliac artery is 19 mm. Internal and external iliac arteries show scattered plaque without evidence of significant stenosis or aneurysmal disease. The common femoral arteries are  normally patent bilaterally and femoral bifurcations are also well demonstrated and normally patent. The distal right common femoral artery is mildly aneurysmal and measures 13 mm in diameter.  Nonvascular evaluation demonstrates hepatic steatosis with probable early cirrhotic changes of the liver based on periportal atrophy and enlargement of the left lobe and caudate. The pancreas is atrophic. Arterial phase evaluation of both solid organs is unremarkable. Bowel shows diverticulosis of the sigmoid colon. The bladder is unremarkable and there are clips present likely related to prior prostatectomy and lymph node dissection in the retroperitoneum. No focal masses, enlarged lymph nodes or hernias are identified. No significant bony abnormalities.  Review of the MIP images confirms the above findings.  IMPRESSION: 1. No evidence of acute aortic dissection. 2. COPD without acute pulmonary process. 3. Significant calcified plaque throughout the coronary arteries in a 3 vessel distribution and also likely at the level of the  left main coronary artery. 4. Enlargement of infrarenal abdominal aortic aneurysm since prior CTA in 2008. Maximal aneurysm diameter has increased from 4.1 cm to 5.1 cm. No evidence of dissection or leak at the level of the abdominal aortic aneurysm. 5. Moderate stenosis at the origins of the superior mesenteric artery and left renal artery. 6. Aneurysmal disease involving distal common iliac arteries bilaterally and the distal right common femoral artery. 7. Hepatic steatosis with probable early cirrhosis.   Electronically Signed   By: Aletta Edouard M.D.   On: 03/04/2014 09:49   Ct Cta Abd/pel W/cm &/or W/o Cm  03/04/2014   CLINICAL DATA:  Sharp back pain and chest pain. Known abdominal aortic aneurysm.  EXAM: CT ANGIOGRAPHY CHEST, ABDOMEN AND PELVIS  TECHNIQUE: Multidetector CT imaging through the chest, abdomen and pelvis was performed using the standard protocol during bolus administration  of intravenous contrast. Multiplanar reconstructed images and MIPs were obtained and reviewed to evaluate the vascular anatomy.  CONTRAST:  181mL OMNIPAQUE IOHEXOL 350 MG/ML SOLN  COMPARISON:  CTA of the abdomen and pelvis dated 01/05/2007  FINDINGS: CTA CHEST FINDINGS  The thoracic aorta is well opacified and shows normal caliber without evidence of aneurysmal disease. No intramural hemorrhage or aortic dissection is identified. Proximal great vessels show normal branching anatomy and no evidence of significant occlusive disease or aneurysm. Calcified plaque is present throughout much of the thoracic aorta without evidence of penetrating ulcer disease.  The pulmonary arteries are also very well opacified on the study. The study is adequate to exclude pulmonary embolism.  The heart size is normal. Calcified plaque is identified in a 3 vessel distribution within the RCA, LAD and left circumflex arteries. There also may be calcified plaque at the level of the left main coronary artery.  Lungs show evidence of COPD with mild emphysematous changes present as well as scattered areas of parenchymal scarring. No masses or enlarged lymph nodes are seen. No evidence of pulmonary edema, airspace consolidation or pneumothorax. Visualized airways are normally patent. Bony structures are unremarkable.  Review of the MIP images confirms the above findings.  CTA ABDOMEN AND PELVIS FINDINGS  The abdominal aorta shows infrarenal aneurysmal disease which has enlarged since prior CTA evaluation in 2008. Maximum AP diameter of the aneurysm is now 5.1 cm compared to 4.1 cm and transverse width is 4.5 cm. Infrarenal neck length is roughly 3 cm with the neck showing a oblong dimensions of 2.5 cm in with and 3.2 cm in AP diameter. Total fusiform aneurysm length is approximately 7.5 cm with the aneurysmal dilatation terminating approximately 3 cm proximal to the aortic bifurcation. No associated dissection or evidence of aneurysm leak.  Aneurysm contains eccentric mural thrombus on the left and posterior aspects of the aortic lumen.  Calcified plaque at the origin of the celiac axis causes approximately 25-30% stenosis. Calcified plaque at the origin of the superior mesenteric artery causes approximately 50- 60% narrowing. Calcified plaque at the origin of a single right renal artery causes approximately 30% narrowing. Calcified plaque at the origin of a single left renal artery causes approximately 50-60% narrowing. The inferior mesenteric artery is open with the fusiform aneurysm terminating chest above the IMA origin.  Both common iliac arteries are very tortuous. Distal segments of the common iliac arteries show aneurysmal disease bilaterally. Maximal diameter of the distal right common iliac artery is 18 mm and maximum diameter of the distal left common iliac artery is 19 mm. Internal and external iliac arteries show scattered  plaque without evidence of significant stenosis or aneurysmal disease. The common femoral arteries are normally patent bilaterally and femoral bifurcations are also well demonstrated and normally patent. The distal right common femoral artery is mildly aneurysmal and measures 13 mm in diameter.  Nonvascular evaluation demonstrates hepatic steatosis with probable early cirrhotic changes of the liver based on periportal atrophy and enlargement of the left lobe and caudate. The pancreas is atrophic. Arterial phase evaluation of both solid organs is unremarkable. Bowel shows diverticulosis of the sigmoid colon. The bladder is unremarkable and there are clips present likely related to prior prostatectomy and lymph node dissection in the retroperitoneum. No focal masses, enlarged lymph nodes or hernias are identified. No significant bony abnormalities.  Review of the MIP images confirms the above findings.  IMPRESSION: 1. No evidence of acute aortic dissection. 2. COPD without acute pulmonary process. 3. Significant calcified  plaque throughout the coronary arteries in a 3 vessel distribution and also likely at the level of the left main coronary artery. 4. Enlargement of infrarenal abdominal aortic aneurysm since prior CTA in 2008. Maximal aneurysm diameter has increased from 4.1 cm to 5.1 cm. No evidence of dissection or leak at the level of the abdominal aortic aneurysm. 5. Moderate stenosis at the origins of the superior mesenteric artery and left renal artery. 6. Aneurysmal disease involving distal common iliac arteries bilaterally and the distal right common femoral artery. 7. Hepatic steatosis with probable early cirrhosis.   Electronically Signed   By: Aletta Edouard M.D.   On: 03/04/2014 09:49    Discharge Medications   Current Discharge Medication List    CONTINUE these medications which have CHANGED   Details  losartan (COZAAR) 100 MG tablet Take 1 tablet (100 mg total) by mouth daily. Qty: 30 tablet, Refills: 6      CONTINUE these medications which have NOT CHANGED   Details  ALPRAZolam (XANAX) 0.25 MG tablet Take 0.25 mg by mouth as needed. (patient states 30 tablets will last him a year - takes very rarely)    amLODipine (NORVASC) 10 MG tablet Take 1 tablet by mouth Daily.    aspirin 81 MG tablet Take 81 mg by mouth daily.      atorvastatin (LIPITOR) 40 MG tablet Take 40 mg by mouth daily.      metoprolol succinate (TOPROL-XL) 50 MG 24 hr tablet TAKE ONE (1) TABLET EACH DAY    multivitamin (THERAGRAN) per tablet Take 1 tablet by mouth daily. One-A-Day for Women    Psyllium (METAMUCIL PO) Take by mouth.        Disposition   The patient will be discharged in stable condition to home. Discharge Instructions    Diet - low sodium heart healthy    Complete by:  As directed      Increase activity slowly    Complete by:  As directed   Please monitor your blood pressure occasionally at home. This can also be done at your local pharmacy if they have a blood pressure cuff (like [facility], [facility], etc). Call your doctor if you tend to get readings of greater than 130 on the top number or 80 on the bottom number.  Your losartan has been increased.          Follow-up Information    Follow up with EARLY, TODD, MD.   Specialty:  Vascular Surgery   Why:  03/13/14 at 12:15pm - please arrive at 12:00pm   Contact information:   [address]  [place] [address] [phone]       Follow up with Peter Martinique, MD.   Specialty:  Cardiology   Why:  03/23/14 at 2:15pm   Contact information:   [address] [place] [address] [phone]         Duration of Discharge Encounter: Greater than 30 minutes including physician and PA time.  Signed, Melina Copa PA-C 03/05/2014, 4:41 PM

## 2014-03-11 ENCOUNTER — Emergency Department (HOSPITAL_COMMUNITY)
Admission: EM | Admit: 2014-03-11 | Discharge: 2014-03-11 | Disposition: A | Discharge status: Home / Self Care | Payer: Medicare Other | Attending: Emergency Medicine | Admitting: Emergency Medicine

## 2014-03-11 ENCOUNTER — Other Ambulatory Visit: Payer: Self-pay

## 2014-03-11 DIAGNOSIS — E785 Hyperlipidemia, unspecified: Secondary | ICD-10-CM | POA: Diagnosis not present

## 2014-03-11 DIAGNOSIS — Z7982 Long term (current) use of aspirin: Secondary | ICD-10-CM | POA: Diagnosis not present

## 2014-03-11 DIAGNOSIS — Z8546 Personal history of malignant neoplasm of prostate: Secondary | ICD-10-CM | POA: Insufficient documentation

## 2014-03-11 DIAGNOSIS — I10 Essential (primary) hypertension: Secondary | ICD-10-CM | POA: Diagnosis present

## 2014-03-11 DIAGNOSIS — Z8673 Personal history of transient ischemic attack (TIA), and cerebral infarction without residual deficits: Secondary | ICD-10-CM | POA: Diagnosis not present

## 2014-03-11 DIAGNOSIS — Z8719 Personal history of other diseases of the digestive system: Secondary | ICD-10-CM | POA: Diagnosis not present

## 2014-03-11 DIAGNOSIS — Z87891 Personal history of nicotine dependence: Secondary | ICD-10-CM | POA: Insufficient documentation

## 2014-03-11 DIAGNOSIS — Z8619 Personal history of other infectious and parasitic diseases: Secondary | ICD-10-CM | POA: Diagnosis not present

## 2014-03-11 DIAGNOSIS — Z79899 Other long term (current) drug therapy: Secondary | ICD-10-CM | POA: Diagnosis not present

## 2014-03-11 NOTE — ED Provider Notes (Signed)
CSN: [id number]     Arrival date & time 03/11/14  1540 History   First MD Initiated Contact with Patient 03/11/14 1641     Chief Complaint  Patient presents with  . Hypertension     (Consider location/radiation/quality/duration/timing/severity/associated sxs/prior Treatment) Patient is a 80 y.o. male presenting with hypertension. The history is provided by the patient.  Hypertension This is a recurrent problem. The current episode started today. The problem occurs rarely. Pertinent negatives include no abdominal pain, chest pain, chills, congestion, coughing, fever, headaches, neck pain, numbness, rash or vomiting. Nothing aggravates the symptoms. He has tried nothing for the symptoms.    Past Medical History  Diagnosis Date  . Hypertension   . Hyperlipidemia   . PVC's (premature ventricular contractions)   . Premature atrial contraction   . GERD (gastroesophageal reflux disease)   . History of TIAs     Questionable CVA in 1989  . Abdominal aortic aneurysm     a. 03/2013 Abd Duplex: AAA 4.2 x 4.2 cm. b. 02/2014: CTA no dissection; increase in aneurysm to 5.1cm,  . Prostate cancer   . History of measles, mumps, or rubella   . Status post carotid endarterectomy   . Carotid artery occlusion     a. s/p R CEA 1998;  b. 03/2013 U/S: RICA < 96%, LICA 22-29%.  . H/O cardiovascular stress test     a. Nuc 02/2014:  EF 79%, inferior infarction with preserved EF versus diaphragmatic attenuation given normal inferior wall motion, no evidence of ischemia.  [name] PVD (peripheral vascular disease)     a. CT 02/2014: mod stenosis at origins of SMA and L renal artery.  . Hepatic steatosis     a. Hepatic steatosis with probable early cirrhosis on CT 02/2014.   Past Surgical History  Procedure Laterality Date  . Appendectomy    . Tonsillectomy    . Hernia repair    . Prostate surgery    . Total knee arthroplasty      right  . Eye surgery  July 2012    Cataract Bilateral eye  . Eye surgery   Dec. 18, 2013    Right lower eyelid surgery  . Carotid endarterectomy Right 1998    cea   Family History  Problem Relation Age of Onset  . Stroke Father   . Heart failure Mother    History  Substance Use Topics  . Smoking status: Former Smoker    Quit date: 04/14/1979  . Smokeless tobacco: Never Used  . Alcohol Use: No    Review of Systems  Constitutional: Negative for fever and chills.  HENT: Negative for congestion and rhinorrhea.   Eyes: Negative for visual disturbance.  Respiratory: Negative for cough and shortness of breath.   Cardiovascular: Negative for chest pain.  Gastrointestinal: Negative for vomiting, abdominal pain, diarrhea and constipation.  Endocrine: Negative for polyuria.  Genitourinary: Negative for dysuria and flank pain.  Musculoskeletal: Negative for back pain and neck pain.  Skin: Negative for rash and wound.  Neurological: Negative for dizziness, numbness and headaches.      Allergies  Ciprofloxacin; Cleocin; Neomycin; and Tobradex  Home Medications   Prior to Admission medications   Medication Sig Start Date End Date Taking? Authorizing Provider  ALPRAZolam (XANAX) 0.25 MG tablet Take 0.25 mg by mouth as needed.      Historical Provider, MD  amLODipine (NORVASC) 10 MG tablet Take 1 tablet by mouth Daily. 10/16/10   Historical Provider, MD  aspirin 81 MG  tablet Take 81 mg by mouth daily.      Historical Provider, MD  atorvastatin (LIPITOR) 40 MG tablet Take 40 mg by mouth daily.      Historical Provider, MD  losartan (COZAAR) 100 MG tablet Take 1 tablet (100 mg total) by mouth daily. 03/05/14   Dayna N Dunn, PA-C  metoprolol succinate (TOPROL-XL) 50 MG 24 hr tablet TAKE ONE (1) TABLET EACH DAY    Peter M Martinique, MD  multivitamin [facility]) per tablet Take 1 tablet by mouth daily. One-A-Day for Women    Historical Provider, MD  Psyllium (METAMUCIL PO) Take by mouth.    Historical Provider, MD   BP 130/75 mmHg  Pulse 62  Temp(Src) 97.7 F (36.5  C) (Oral)  Resp 17  Ht 5' 9.5" (1.765 m)  Wt 167 lb (75.751 kg)  BMI 24.32 kg/m2  SpO2 97% Physical Exam  Constitutional: He is oriented to person, place, and time. He appears well-developed and well-nourished.  HENT:  Head: Normocephalic and atraumatic.  Eyes: Conjunctivae and EOM are normal.  Neck: Normal range of motion. Neck supple.  Cardiovascular: Normal rate and regular rhythm.   Pulmonary/Chest: Effort normal. No respiratory distress.  Abdominal: Soft. There is no tenderness.  Musculoskeletal: Normal range of motion. He exhibits no edema or tenderness.  Neurological: He is alert and oriented to person, place, and time.  Skin: Skin is warm and dry.  Nursing note and vitals reviewed.   ED Course  Procedures (including critical care time) Labs Review Labs Reviewed - No data to display  Imaging Review No results found.   EKG Interpretation None      MDM   Final diagnoses:  Paroxysmal hypertension    80 yo M w/ AAA had an episode of asymptomatic hypertension at home. Multiple normal BP's in ED with one that was high. Still asymptomatic. These BP's here are well below AHA guidelines for AAA with h/o smoking (recommends less than 140). Doubt hypertensive emergency, no belly symptoms doubt ruptured AAA. Will return here for any symptomatic hypertension, otherwise will keep log of BP's and follow up with PCP for medication management.      Merrily Pew, MD 03/12/14 2426  Shaune Pollack, MD 03/15/14 [phone]

## 2014-03-11 NOTE — ED Notes (Signed)
Pt reports to ED c/o of a hypertensive episode. Pt states he took is BP at home and it was 169/74 Pt on BP medications at home. Pt is concerned because he has an abdominal anuerysm approx 5.5 cm X 5.5 cm. Pt denies any C/P or headache. NAD noted in triage. Pt was last seen here on Monday night.

## 2014-03-11 NOTE — Discharge Instructions (Signed)
Please keep a log of your Blood pressures. Take it twice a day at the same time and situation each day and record these numbers. Also, take it if you feel you are having symptoms from your BP and record the number with the symptom.  If you have a high reading and are not having symptoms, please take BP again and return to ED if persistently >270 systolic or symptomatic.

## 2014-03-12 ENCOUNTER — Encounter: Payer: Self-pay | Admitting: Vascular Surgery

## 2014-03-13 ENCOUNTER — Ambulatory Visit (INDEPENDENT_AMBULATORY_CARE_PROVIDER_SITE_OTHER): Payer: Medicare Other | Admitting: Vascular Surgery

## 2014-03-13 ENCOUNTER — Encounter: Payer: Self-pay | Admitting: Vascular Surgery

## 2014-03-13 VITALS — BP 137/80 | HR 69 | Resp 18 | Ht 69.0 in | Wt 171.7 lb

## 2014-03-13 DIAGNOSIS — I714 Abdominal aortic aneurysm, without rupture, unspecified: Secondary | ICD-10-CM

## 2014-03-13 DIAGNOSIS — I6523 Occlusion and stenosis of bilateral carotid arteries: Secondary | ICD-10-CM

## 2014-03-13 NOTE — Addendum Note (Signed)
Addended by: Mena Goes on: 03/13/2014 04:59 PM   Modules accepted: Orders

## 2014-03-13 NOTE — Progress Notes (Signed)
Patient name: Eric Carpenter MRN: [id number] DOB: 20-Oct-1926 Sex: male   Referred by: Lysbeth Galas  Reason for referral:  Chief Complaint  Patient presents with  . Follow-up    enlargening AAA  CT on 03-04-2014   . AAA    HISTORY OF PRESENT ILLNESS: Reason presents today for discussion of recent discovery of possible enlargement of his known infrarenal abdominal aortic aneurysm. Of note the patient for many years. He is status post carotid endarterectomy for symptomatic right carotid disease by myself in 1998. He has a known small aneurysm since that time and has been followed with serial ultrasounds. Most recent ultrasound was in 2014 and at that time his maximal diameter was predicted at 4.2 cm. He did have a CT scan in 2008 with a maximal diameter of 4.1 at that time. He had a episode of fleeting back pain and presented to the emergency room for evaluation. He reports this only lasted for a few minutes and is completely resolved and has had no difficulty of this since that time. CT scan showed no evidence of leak but the prediction was of a 5.1 cm aneurysm  Past Medical History  Diagnosis Date  . Hypertension   . Hyperlipidemia   . PVC's (premature ventricular contractions)   . Premature atrial contraction   . GERD (gastroesophageal reflux disease)   . History of TIAs     Questionable CVA in 1989  . Abdominal aortic aneurysm     a. 03/2013 Abd Duplex: AAA 4.2 x 4.2 cm. b. 02/2014: CTA no dissection; increase in aneurysm to 5.1cm,  . Prostate cancer   . History of measles, mumps, or rubella   . Status post carotid endarterectomy   . Carotid artery occlusion     a. s/p R CEA 1998;  b. 03/2013 U/S: RICA < 63%, LICA 01-60%.  . H/O cardiovascular stress test     a. Nuc 02/2014:  EF 79%, inferior infarction with preserved EF versus diaphragmatic attenuation given normal inferior wall motion, no evidence of ischemia.  [name] PVD (peripheral vascular disease)     a. CT 02/2014: mod  stenosis at origins of SMA and L renal artery.  . Hepatic steatosis     a. Hepatic steatosis with probable Sewell Pitner cirrhosis on CT 02/2014.    Past Surgical History  Procedure Laterality Date  . Appendectomy    . Tonsillectomy    . Hernia repair    . Prostate surgery    . Total knee arthroplasty      right  . Eye surgery  July 2012    Cataract Bilateral eye  . Eye surgery  Dec. 18, 2013    Right lower eyelid surgery  . Carotid endarterectomy Right 1998    cea    History   Social History  . Marital Status: Divorced    Spouse Name: N/A    Number of Children: 0  . Years of Education: N/A   Occupational History  . department manager    Social History Main Topics  . Smoking status: Former Smoker    Quit date: 04/14/1979  . Smokeless tobacco: Never Used  . Alcohol Use: 0.6 - 1.2 oz/week    1-2 Glasses of wine per week  . Drug Use: No  . Sexual Activity: Not on file   Other Topics Concern  . Not on file   Social History Narrative   Patient lives in [place] with his significant other. She suffers from dementia and he  is her primary care provider. He walks 2.5 miles 5 days per week.    Family History  Problem Relation Age of Onset  . Stroke Father   . Heart failure Mother     Allergies as of 03/13/2014 - Review Complete 03/13/2014  Allergen Reaction Noted  . Ciprofloxacin Anxiety 11/11/2010  . Cleocin [clindamycin hcl] Anxiety 03/22/2013  . Neomycin Other (See Comments) 09/25/2013  . Tobradex [tobramycin-dexamethasone]  09/25/2013    Current Outpatient Prescriptions on File Prior to Visit  Medication Sig Dispense Refill  . ALPRAZolam (XANAX) 0.25 MG tablet Take 0.25 mg by mouth as needed.      [name] amLODipine (NORVASC) 10 MG tablet Take 1 tablet by mouth Daily.    [name] aspirin 81 MG tablet Take 81 mg by mouth daily.      [name] atorvastatin (LIPITOR) 40 MG tablet Take 40 mg by mouth daily.      [name] losartan (COZAAR) 100 MG tablet Take 1 tablet (100 mg total) by mouth  daily. 30 tablet 6  . metoprolol succinate (TOPROL-XL) 50 MG 24 hr tablet TAKE ONE (1) TABLET EACH DAY 30 tablet 6  . multivitamin (THERAGRAN) per tablet Take 1 tablet by mouth daily. One-A-Day for Women    . Psyllium (METAMUCIL PO) Take by mouth.     No current facility-administered medications on file prior to visit.       PHYSICAL EXAMINATION:  General: The patient is a well-nourished male, in no acute distress. Vital signs are BP 137/80 mmHg  Pulse 69  Resp 18  Ht 5\' 9"  (1.753 m)  Wt 171 lb 11.2 oz (77.883 kg)  BMI 25.34 kg/m2 Pulmonary: There is a good air exchange  Abdomen: Soft and non-tender with normal pitch bowel sounds. No aneurysm palpable and no tenderness noted Musculoskeletal: There are no major deformities.  There is no significant extremity pain. Neurologic: No focal weakness or paresthesias are detected, Skin: There are no ulcer or rashes noted. Psychiatric: The patient has normal affect. Cardiovascular: 2+ femoral, 2+ radial, 2+ popliteal and 2+ dorsalis pedis pulses bilaterally   I reviewed his CT scan from 03/04/2014. I feel that there is an over call of his maximal diameter. The measurements are mostly in the 4.5 cm range. There is one measurement of 5.1 but this does appear to be on a bias Constance Holster true axis of the aorta. I have compared the axial sagittal and coronal views.  Impression and Plan:  Had a very long discussion with the patient. I do feel that he has had a slight increase in size of his aneurysm since his ultrasound in 2014. I do not feel that this is a 1 cm growth in one year. I have recommended that we see him again with ultrasound in 6 months to rule out continued growth. I feel that his risk of repair although small in his age of 6 would be greater than his risk for rupture and his current size. Do not feel this represents rapid growth. I again reviewed symptoms of aneurysm he knows report immediately should this occur. Otherwise we'll see him  again in 6 months with ultrasound of his aorta also carotid duplex follow-up    Omaya Nieland [facility] Office: [phone]

## 2014-03-21 ENCOUNTER — Other Ambulatory Visit (HOSPITAL_COMMUNITY): Payer: Self-pay | Admitting: Gastroenterology

## 2014-03-21 DIAGNOSIS — R1310 Dysphagia, unspecified: Secondary | ICD-10-CM

## 2014-03-23 ENCOUNTER — Encounter: Payer: Self-pay | Admitting: Cardiology

## 2014-03-23 ENCOUNTER — Ambulatory Visit (INDEPENDENT_AMBULATORY_CARE_PROVIDER_SITE_OTHER): Payer: Medicare Other | Admitting: Cardiology

## 2014-03-23 VITALS — BP 112/64 | HR 64 | Ht 69.5 in | Wt 174.7 lb

## 2014-03-23 DIAGNOSIS — I10 Essential (primary) hypertension: Secondary | ICD-10-CM

## 2014-03-23 DIAGNOSIS — I2584 Coronary atherosclerosis due to calcified coronary lesion: Secondary | ICD-10-CM

## 2014-03-23 DIAGNOSIS — I714 Abdominal aortic aneurysm, without rupture, unspecified: Secondary | ICD-10-CM

## 2014-03-23 DIAGNOSIS — I2510 Atherosclerotic heart disease of native coronary artery without angina pectoris: Secondary | ICD-10-CM

## 2014-03-23 DIAGNOSIS — I6523 Occlusion and stenosis of bilateral carotid arteries: Secondary | ICD-10-CM

## 2014-03-23 DIAGNOSIS — I493 Ventricular premature depolarization: Secondary | ICD-10-CM

## 2014-03-23 MED ORDER — LOSARTAN POTASSIUM 50 MG PO TABS: 50.0000 mg | ORAL_TABLET | Freq: Two times a day (BID) | ORAL | Status: DC

## 2014-03-23 NOTE — Progress Notes (Signed)
Frutoso Schatz Demarais Date of Birth: Mar 18, 1927   History of Present Illness: Mr. Newsham is seen today for followup AAA and HTN. He was admitted in November with back and chest pain. He had a CT abd/pelvis which showed some increase in AAA. This was reviewed by Dr. Donnetta Hutching who felt it was 4.5 cm in diameter. Recommended follow up US in 6 months. Stress Myoview showed inferior attenuation without ischemia. EF was normal.  His losartan was increased to 100 mg daily. Since then he reports he has had no chest pain. His BP has been doing very well but he has times when he becomes dizzy and feels swimmy headed. Thinks this is related to Cozaar. Still walking 3.5 mi/day.   Current Outpatient Prescriptions on File Prior to Visit  Medication Sig Dispense Refill  . ALPRAZolam (XANAX) 0.25 MG tablet Take 0.25 mg by mouth as needed.      [name] amLODipine (NORVASC) 10 MG tablet Take 1 tablet by mouth Daily.    [name] aspirin 81 MG tablet Take 81 mg by mouth daily.      [name] atorvastatin (LIPITOR) 40 MG tablet Take 40 mg by mouth daily.      . metoprolol succinate (TOPROL-XL) 50 MG 24 hr tablet TAKE ONE (1) TABLET EACH DAY 30 tablet 6  . multivitamin (THERAGRAN) per tablet Take 1 tablet by mouth daily. One-A-Day for Women    . Psyllium (METAMUCIL PO) Take by mouth.     No current facility-administered medications on file prior to visit.    Allergies  Allergen Reactions  . Ciprofloxacin Anxiety  . Cleocin [Clindamycin Hcl] Anxiety  . Neomycin Other (See Comments)    other  . Tobradex [Tobramycin-Dexamethasone]     Opthalmic-burning    Past Medical History  Diagnosis Date  . Hypertension   . Hyperlipidemia   . PVC's (premature ventricular contractions)   . Premature atrial contraction   . GERD (gastroesophageal reflux disease)   . History of TIAs     Questionable CVA in 1989  . Abdominal aortic aneurysm     a. 03/2013 Abd Duplex: AAA 4.2 x 4.2 cm. b. 02/2014: CTA no dissection; increase in aneurysm to  5.1cm,  . Prostate cancer   . History of measles, mumps, or rubella   . Status post carotid endarterectomy   . Carotid artery occlusion     a. s/p R CEA 1998;  b. 03/2013 U/S: RICA < 56%, LICA 81-27%.  . H/O cardiovascular stress test     a. Nuc 02/2014:  EF 79%, inferior infarction with preserved EF versus diaphragmatic attenuation given normal inferior wall motion, no evidence of ischemia.  [name] PVD (peripheral vascular disease)     a. CT 02/2014: mod stenosis at origins of SMA and L renal artery.  . Hepatic steatosis     a. Hepatic steatosis with probable early cirrhosis on CT 02/2014.    Past Surgical History  Procedure Laterality Date  . Appendectomy    . Tonsillectomy    . Hernia repair    . Prostate surgery    . Total knee arthroplasty      right  . Eye surgery  July 2012    Cataract Bilateral eye  . Eye surgery  Dec. 18, 2013    Right lower eyelid surgery  . Carotid endarterectomy Right 1998    cea    History  Smoking status  . Former Smoker  . Quit date: 04/14/1979  Smokeless tobacco  . Never Used  History  Alcohol Use  . 0.6 - 1.2 oz/week  . 1-2 Glasses of wine per week    Family History  Problem Relation Age of Onset  . Stroke Father   . Heart failure Mother     Review of Systems: As noted in history of present illness.  All other systems were reviewed and are negative.  Physical Exam: BP 112/64 mmHg  Pulse 64  Ht 5' 9.5" (1.765 m)  Wt 174 lb 11.2 oz (79.243 kg)  BMI 25.44 kg/m2 He is a pleasant white male in no acute distress. HEENT exam is normal. Neck is supple no JVD adenopathy thyromegaly or bruits. He has an old right carotid endarterectomy scar. Lungs are clear. Cardiac exam reveals a regular rate and rhythm  without gallop, murmur, or click. Abdomen is soft and nontender without masses or bruits. Extremities are without edema. He has good pedal pulses. He is alert and oriented x3. Cranial nerves II through XII are intact.   LABORATORY  DATA:   Assessment / Plan: 1. Chronic PACs and PVCs. He is on appropriate dose of metoprolol now. Not very symptomatic now.   2. Hypertension- controlled. Increased symptoms of dizziness with increase Cozaar dose. Recommend he take 50 mg Bid instead of 100 mg daily. If symptoms persist may need to reduce amlodipine dose.   3. Hyperlipidemia.  4. Imbalance.   5. AAA. Follow up with Dr. Donnetta Hutching  6. S/p CEA on right.

## 2014-03-23 NOTE — Patient Instructions (Signed)
Take Cozaar 50 mg twice a day. If you continue to feel lightheaded let me know.   I will see you in 6 months.

## 2014-04-03 ENCOUNTER — Other Ambulatory Visit (HOSPITAL_COMMUNITY): Payer: Medicare Other

## 2014-04-03 ENCOUNTER — Ambulatory Visit: Payer: Medicare Other | Admitting: Family

## 2014-04-03 ENCOUNTER — Ambulatory Visit (HOSPITAL_COMMUNITY)
Admission: RE | Admit: 2014-04-03 | Discharge: 2014-04-03 | Disposition: A | Discharge status: Home / Self Care | Payer: Medicare Other | Source: Ambulatory Visit | Attending: Gastroenterology | Admitting: Gastroenterology

## 2014-04-03 DIAGNOSIS — K449 Diaphragmatic hernia without obstruction or gangrene: Secondary | ICD-10-CM | POA: Insufficient documentation

## 2014-04-03 DIAGNOSIS — R1310 Dysphagia, unspecified: Secondary | ICD-10-CM

## 2014-04-03 DIAGNOSIS — E785 Hyperlipidemia, unspecified: Secondary | ICD-10-CM | POA: Diagnosis not present

## 2014-04-03 DIAGNOSIS — I10 Essential (primary) hypertension: Secondary | ICD-10-CM | POA: Insufficient documentation

## 2014-04-03 DIAGNOSIS — Z8673 Personal history of transient ischemic attack (TIA), and cerebral infarction without residual deficits: Secondary | ICD-10-CM | POA: Insufficient documentation

## 2014-04-03 DIAGNOSIS — K219 Gastro-esophageal reflux disease without esophagitis: Secondary | ICD-10-CM | POA: Diagnosis not present

## 2014-04-03 NOTE — Procedures (Signed)
Objective Swallowing Evaluation: Modified Barium Swallowing Study  Patient Details  Name: Eric Carpenter MRN: [id number] Date of Birth: 1926-11-28  Today's Date: 04/03/2014 Time: 1152-1215 SLP Time Calculation (min) (ACUTE ONLY): 23 min  Past Medical History:  Past Medical History  Diagnosis Date  . Hypertension   . Hyperlipidemia   . PVC's (premature ventricular contractions)   . Premature atrial contraction   . GERD (gastroesophageal reflux disease)   . History of TIAs     Questionable CVA in 1989  . Abdominal aortic aneurysm     a. 03/2013 Abd Duplex: AAA 4.2 x 4.2 cm. b. 02/2014: CTA no dissection; increase in aneurysm to 5.1cm,  . Prostate cancer   . History of measles, mumps, or rubella   . Status post carotid endarterectomy   . Carotid artery occlusion     a. s/p R CEA 1998;  b. 03/2013 U/S: RICA < 16%, LICA 38-46%.  . H/O cardiovascular stress test     a. Nuc 02/2014:  EF 79%, inferior infarction with preserved EF versus diaphragmatic attenuation given normal inferior wall motion, no evidence of ischemia.  [name] PVD (peripheral vascular disease)     a. CT 02/2014: mod stenosis at origins of SMA and L renal artery.  . Hepatic steatosis     a. Hepatic steatosis with probable early cirrhosis on CT 02/2014.   Past Surgical History:  Past Surgical History  Procedure Laterality Date  . Appendectomy    . Tonsillectomy    . Hernia repair    . Prostate surgery    . Total knee arthroplasty      right  . Eye surgery  July 2012    Cataract Bilateral eye  . Eye surgery  Dec. 18, 2013    Right lower eyelid surgery  . Carotid endarterectomy Right 1998    cea   HPI:  80 yo male with PMH significant for GERD and hiatal hernia, who presents with increasing difficulty swallowing. He reports a sensation of food getting "stuck" in the "pocket" in the back of his throat.     Assessment / Plan / Recommendation Clinical Impression  Dysphagia Diagnosis: Within Functional  Limits Clinical impression: Pt presents with oropharyngeal function that is within gross function limits for his age, with late onset for swallow initiation that is most evident with thin liquids. As a result, pt has intermittent flash penetration with cup sips of thin liquid and one instance of trace, shallow penetration with thin via straw that cleared easily with cued throat clear. Recommend for patient to continue with regular textures and thin liquids following general aspiration precautions. No indication for SLP f/u at this time.    Treatment Recommendation  No treatment recommended at this time    Diet Recommendation Regular;Thin liquid   Liquid Administration via: Cup;Straw Medication Administration: Whole meds with liquid Compensations: Slow rate;Small sips/bites Postural Changes and/or Swallow Maneuvers: Seated upright 90 degrees;Upright 30-60 min after meal    Other  Recommendations Oral Care Recommendations: Oral care BID   Follow Up Recommendations  None    Frequency and Duration        Pertinent Vitals/Pain n/a    SLP Swallow Goals     General HPI: 80 yo male with PMH significant for GERD and hiatal hernia, who presents with increasing difficulty swallowing. He reports a sensation of food getting "stuck" in the "pocket" in the back of his throat. Type of Study: Modified Barium Swallowing Study Reason for Referral: Objectively evaluate swallowing function  Previous Swallow Assessment: previous MBS and barium swallow in 2009 Diet Prior to this Study: Regular;Thin liquids Respiratory Status: Room air History of Recent Intubation: No Behavior/Cognition: Alert;Cooperative;Pleasant mood Oral Cavity - Dentition: Adequate natural dentition Oral Motor / Sensory Function: Within functional limits Self-Feeding Abilities: Able to feed self Patient Positioning: Upright in chair Baseline Vocal Quality: Clear Volitional Swallow: Able to elicit Anatomy:  (osteophytes C6-7 as  confirmed by MD) Pharyngeal Secretions: Not observed secondary MBS    Reason for Referral Objectively evaluate swallowing function   Oral Phase Oral Preparation/Oral Phase Oral Phase: WFL   Pharyngeal Phase Pharyngeal Phase Pharyngeal Phase: Impaired Pharyngeal - Thin Pharyngeal - Thin Cup: Premature spillage to pyriform sinuses;Penetration/Aspiration during swallow;Pharyngeal residue - valleculae Penetration/Aspiration details (thin cup): Material enters airway, remains ABOVE vocal cords then ejected out Pharyngeal - Thin Straw: Premature spillage to pyriform sinuses;Penetration/Aspiration during swallow;Pharyngeal residue - valleculae Penetration/Aspiration details (thin straw): Material enters airway, remains ABOVE vocal cords and not ejected out Pharyngeal - Solids Pharyngeal - Puree: Within functional limits Pharyngeal - Regular: Within functional limits  Cervical Esophageal Phase    GO    Cervical Esophageal Phase Cervical Esophageal Phase: [facility]    Functional Assessment Tool Used: skilled clinical judgment Functional Limitations: Swallowing Swallow Current Status ([id number]): At least 1 percent but less than 20 percent impaired, limited or restricted Swallow Goal Status [phone]): At least 1 percent but less than 20 percent impaired, limited or restricted Swallow Discharge Status [phone]): At least 1 percent but less than 20 percent impaired, limited or restricted     Eric Carpenter, M.A. CCC-SLP [phone]  Eric Carpenter 04/03/2014, 2:21 PM

## 2014-04-09 ENCOUNTER — Telehealth: Payer: Self-pay | Admitting: Cardiology

## 2014-04-09 MED ORDER — AMLODIPINE BESYLATE 5 MG PO TABS: 5.0000 mg | ORAL_TABLET | Freq: Every day | ORAL | Status: DC

## 2014-04-09 NOTE — Telephone Encounter (Signed)
Let's try to reduce amlodipine to 5 mg daily and see if his symptoms improve.  Gary Bultman Martinique MD, [facility]

## 2014-04-09 NOTE — Telephone Encounter (Signed)
Returned call to patient he stated he is not able to take losartan 50 mg twice a day.Stated he took losartan 50 mg twice a day and it caused severe heart burn,pain in lower abdomen and sides,pain across lower back,chest pain,dizziness.Stated he tried taking for 3 days and each day symptoms became worse.Stated he went back to losartan 50 mg daily with no problems.Dr.Jordan out of office will send message to him for advice.

## 2014-04-09 NOTE — Telephone Encounter (Signed)
Please call,have some concerns about his Losartan.Please call to advise.

## 2014-04-09 NOTE — Addendum Note (Signed)
Addended by: Golden Hurter D on: 04/09/2014 01:19 PM   Modules accepted: Orders, Medications

## 2014-04-09 NOTE — Telephone Encounter (Signed)
Returned call to patient Dr.Jordan advised to decrease amlodipine to 5 mg daily.Advised to monitor B/P and call back if symptoms don't improve.

## 2014-04-11 ENCOUNTER — Telehealth: Payer: Self-pay | Admitting: Cardiology

## 2014-04-11 MED ORDER — AMLODIPINE BESYLATE 5 MG PO TABS: 5.0000 mg | ORAL_TABLET | Freq: Every day | ORAL | Status: DC

## 2014-04-11 NOTE — Telephone Encounter (Signed)
Submitted refill to pharmacy, notified pt.

## 2014-04-11 NOTE — Telephone Encounter (Signed)
Pt need a new prescription for Amlodipine 5 mg #30.Please call to [facility]-[phone],please call this in today.

## 2014-04-12 ENCOUNTER — Telehealth: Payer: Self-pay | Admitting: Cardiology

## 2014-04-12 MED ORDER — METOPROLOL SUCCINATE ER 50 MG PO TB24: ORAL_TABLET | ORAL | Status: DC

## 2014-04-12 NOTE — Addendum Note (Signed)
Addended by: Golden Hurter D on: 04/12/2014 05:16 PM   Modules accepted: Orders, Medications

## 2014-04-12 NOTE — Telephone Encounter (Signed)
Patient states his blood pressure is still running high.

## 2014-04-12 NOTE — Telephone Encounter (Signed)
FORWARD TO [place] LPN

## 2014-04-12 NOTE — Telephone Encounter (Signed)
I would recommend increasing his Toprol to 75 mg daily.  Kaimen Peine Martinique MD, [facility]

## 2014-04-12 NOTE — Telephone Encounter (Signed)
Returned call to patient Dr.Jordan advised to increase Toprol to 75 mg daily.Advised to call back if B/P still elevated.

## 2014-04-12 NOTE — Telephone Encounter (Signed)
Returned call to patient he stated he is concerned about his elevated B/P.Stated he has a aneurysm and Dr.Jordan told him he needed to keep B/P down.Stated he is taking medications as prescribed except for the past 2 days he forgot to take losartan 50 mg twice a day,has only took 50 mg daily.Stated he will take losartan 50 mg twice a day.Stated he wanted to ask Dr.Jordan if he needs to increase any of his medication.B/P today 148/86 pulse 65.Stated B/P has been no higher than 148/86. Message sent to [place] for advice.

## 2014-04-16 ENCOUNTER — Telehealth: Payer: Self-pay | Admitting: Cardiology

## 2014-04-16 NOTE — Telephone Encounter (Signed)
This message from the answering service: Please call wants to review his medicine.

## 2014-04-16 NOTE — Telephone Encounter (Signed)
Returned call to patient he wanted to clarify medication.All medications reviewed with patient he verbalized understanding directions.Advised if B/P continues to be elevated call back.

## 2014-04-16 NOTE — Telephone Encounter (Signed)
Mr.Emile is calling back , because he has some questions about hi medications. Thanks

## 2014-04-24 ENCOUNTER — Telehealth: Payer: Self-pay | Admitting: Cardiology

## 2014-04-24 NOTE — Telephone Encounter (Signed)
Pt called in wanting to report his BP his meds have been changed. He says that it is ranging from 145/93 to 151/92. He says that he is still having some chest and intestinal pain from the different medications and just thought Dr. Martinique should be aware of this. Please call  Thanks

## 2014-04-24 NOTE — Telephone Encounter (Signed)
Called, NA

## 2014-04-25 ENCOUNTER — Telehealth: Payer: Self-pay | Admitting: Cardiology

## 2014-04-25 DIAGNOSIS — I10 Essential (primary) hypertension: Secondary | ICD-10-CM

## 2014-04-25 NOTE — Telephone Encounter (Signed)
Returned call to patient he called to report B/P still elevated ranging,144/83 pulse 63,146/87 p 63,146/87 p 63,152/89 p 59,156/97 p 62,145/84 p 62,145/93 p 59.Stated he has been having a sharp pain across lower back,across lower abdomen,and rt chest.Stated he thinks losartan is causing pain.Message sent to [place] for advice.

## 2014-04-25 NOTE — Telephone Encounter (Signed)
Talked to pt's wife, pt is out of home, I told her I will try back tomorrow.

## 2014-04-25 NOTE — Telephone Encounter (Signed)
His BP looked good in our last 3 office visits. Is his BP monitor accurate? We can stop the losartan and try HCTZ 25 mg daily. Check a BMET after 3 weeks.  Caliegh Middlekauff Martinique MD, [facility]

## 2014-04-25 NOTE — Telephone Encounter (Signed)
Pt called in wanting to speak to [facility] and give her a report of his recent BP readings . Please call  Thanks

## 2014-04-25 NOTE — Telephone Encounter (Signed)
Returned call to patient no answer.LMTC. 

## 2014-04-26 ENCOUNTER — Telehealth: Payer: Self-pay | Admitting: *Deleted

## 2014-04-26 MED ORDER — HYDROCHLOROTHIAZIDE 25 MG PO TABS: 25.0000 mg | ORAL_TABLET | Freq: Every day | ORAL | Status: AC

## 2014-04-26 NOTE — Telephone Encounter (Signed)
Returned call to patient Dr.Jordan advised to stop losartan,start hctz 25 mg daily.Bmet to be done in 3 weeks.Lab order mailed.Advised to call back if continues to have lower abdominal pain,chest pain,lower back pain.

## 2014-04-26 NOTE — Telephone Encounter (Signed)
See previous 04/26/13 note.

## 2014-04-26 NOTE — Addendum Note (Signed)
Addended by: Golden Hurter D on: 04/26/2014 02:26 PM   Modules accepted: Orders, Medications

## 2014-04-26 NOTE — Telephone Encounter (Signed)
This encounter prematurely closed by other user, opened new telephone note for physician correspondence.

## 2014-04-26 NOTE — Telephone Encounter (Signed)
Spoke w. Patient following up on a call from earlier this week.   Pt has been on losartan for a while, but reports having a lot of symptoms indicative of allergic reaction or intolerance to the med. States this had started when the dose was initially moved from a low dose to a higher dose. Reported symptoms continued in spite of his dose being reduced recently. He reports chest wall pain to the right and left sides, matching type of pain he also has in groin and sides. He reports a reflux-type of discomfort also.  He had first been getting higher BP reads (150s/90s) since the medication was reduced, but this has resolved. Last couple BP checks were back in normal ranges for him, this AM's was 117/69 w/ pulse of 62.  Pt concerned about the symptoms he's having.. If this is related to the losartan, could he try a different type of medication?

## 2014-05-09 ENCOUNTER — Ambulatory Visit (INDEPENDENT_AMBULATORY_CARE_PROVIDER_SITE_OTHER): Payer: Medicare Other | Admitting: Physician Assistant

## 2014-05-09 ENCOUNTER — Encounter: Payer: Self-pay | Admitting: Physician Assistant

## 2014-05-09 VITALS — BP 134/92 | HR 72 | Ht 69.5 in | Wt 171.9 lb

## 2014-05-09 DIAGNOSIS — R300 Dysuria: Secondary | ICD-10-CM

## 2014-05-09 DIAGNOSIS — I10 Essential (primary) hypertension: Secondary | ICD-10-CM

## 2014-05-09 DIAGNOSIS — R52 Pain, unspecified: Secondary | ICD-10-CM

## 2014-05-09 NOTE — Patient Instructions (Signed)
Have your lab work drawn today.  No changes have been made to your medications today  Your physician recommends that you schedule a follow-up appointment in: 1 month with Dr.Jordan.

## 2014-05-09 NOTE — Progress Notes (Signed)
Cardiology Office Note  Date:  05/09/2014   ID:  Eric Carpenter, DOB 20-Aug-1926, MRN [id number]  PCP:  Eric Pavlov, MD  Cardiologist:  Dr Eric Carpenter  Eric Dambrosia, PA-C   Chief Complaint  Patient presents with  . Medication Problem    still having pain in kidneys and random pains all over    History of Present Illness: Eric Carpenter is a 81 y.o. male with a history of AAA and HTN. Admitted 02/2014 with back and chest pain. CT abd/pelvis showed some increase in AAA. Dr. Donnetta Hutching felt it was 4.5 cm in diameter, f/u US 6 months. Stress Myoview showed inferior attenuation without ischemia, EF normal. Better BP control was desired so his losartan was increased.  He presents for evaluation of abd and other pain, ?S.E. From BP meds.    He developed apparent side effects of this medication, including right and left lower abdominal pain as well as other sharp pains in his head arms and also describes occasional paresthesias in his hands.   Amlodipine was decreased from 10 mg down to 5 mg daily.  Metoprolol XL was increased from 50 mg up to 75 mg daily.   The losartan was discontinued and he was started on HCTZ 25 mg daily on 04/26/2014.   He has continued to have the side effects. They have decreased in frequency and intensity but are still present. The most consistent discomfort is in his lower abdomen which is a roll on both sides and radiates towards the center, then down his urethra. He denies hematuria. He had some dark stools several weeks ago but that was after eating greens for a couple of days. He monitored the color of his stools carefully and within a couple of days he was back to normal. He has seen no bright red blood and has no further melena.  He is under a great deal of stress all the time. 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 She was diagnosed with Alzheimer's several years ago. He provides care for her and feels he can continue to  do this, but it is stressful for him. He is eating and sleeping well. He continues to exercise 5 days a week walking 2.5 miles without difficulty. He has no chest pain or inappropriate shortness of breath.  Past Medical History  Diagnosis Date  . Hypertension   . Hyperlipidemia   . PVC's (premature ventricular contractions)   . Premature atrial contraction   . GERD (gastroesophageal reflux disease)   . History of TIAs     Questionable CVA in 1989  . Abdominal aortic aneurysm     a. 03/2013 Abd Duplex: AAA 4.2 x 4.2 cm. b. 02/2014: CTA no dissection; increase in aneurysm to 5.1cm,  . Prostate cancer   . History of measles, mumps, or rubella   . Status post carotid endarterectomy   . Carotid artery occlusion     a. s/p R CEA 1998;  b. 03/2013 U/S: RICA < 80%, LICA 03-49%.  . H/O cardiovascular stress test     a. Nuc 02/2014:  EF 79%, inferior infarction with preserved EF versus diaphragmatic attenuation given normal inferior wall motion, no evidence of ischemia.  [name] PVD (peripheral vascular disease)     a. CT 02/2014: mod stenosis at origins of SMA and L renal artery.  . Hepatic steatosis     a. Hepatic steatosis with probable early cirrhosis on CT 02/2014.    Past  Surgical History  Procedure Laterality Date  . Appendectomy    . Tonsillectomy    . Hernia repair    . Prostate surgery    . Total knee arthroplasty      right  . Eye surgery  July 2012    Cataract Bilateral eye  . Eye surgery  Dec. 18, 2013    Right lower eyelid surgery  . Carotid endarterectomy Right 1998    cea    Current Outpatient Prescriptions  Medication Sig Dispense Refill  . albuterol (PROAIR HFA) 108 (90 BASE) MCG/ACT inhaler Inhale 1-2 puffs into the lungs as needed for wheezing or shortness of breath.    . ALPRAZolam (XANAX) 0.25 MG tablet Take 0.25 mg by mouth as needed.      [name] amLODipine (NORVASC) 5 MG tablet Take 1 tablet (5 mg total) by mouth daily. 30 tablet 6  . aspirin 81 MG tablet Take 81 mg  by mouth daily.      [name] atorvastatin (LIPITOR) 40 MG tablet Take 40 mg by mouth daily.      . hydrochlorothiazide (HYDRODIURIL) 25 MG tablet Take 1 tablet (25 mg total) by mouth daily. 30 tablet 6  . metoprolol succinate (TOPROL-XL) 50 MG 24 hr tablet Take 1&1/2 tablets daily 60 tablet 6  . multivitamin (Eric Carpenter) per tablet Take 1 tablet by mouth daily. Eric Carpenter    . Psyllium (METAMUCIL PO) Take by mouth.     No current facility-administered medications for this visit.    Allergies:   Ciprofloxacin; Cleocin; Neomycin; and Tobradex  Social History:  The patient  reports that he quit smoking about 35 years ago. He has never used smokeless tobacco. He reports that he drinks about 0.6 - 1.2 oz of alcohol per week. He reports that he does not use illicit drugs.   Family History:  The patient's family history includes Heart failure in his mother; Stroke in his father.    ROS:  Please see the history of present illness.   Otherwise, review of systems are positive for none.   All other systems are reviewed and negative.   PHYSICAL EXAM: VS:  BP 134/92 mmHg  Pulse 72  Ht 5' 9.5" (1.765 m)  Wt 171 lb 14.4 oz (77.973 kg)  BMI 25.03 kg/m2 , BMI Body mass index is 25.03 kg/(m^2). GEN: Well nourished, well developed, in no acute distress HEENT: normal Neck: no JVD, carotid bruits, or masses Cardiac: RRR; no murmurs, rubs, or gallops,no edema  Respiratory:  clear to auscultation bilaterally, normal work of breathing GI: soft, lateral tenderness bilaterally without guarding, nondistended, + BS MS: no deformity or atrophy Skin: warm and dry, no rash Neuro:  Strength and sensation are intact   EKG:  EKG is not ordered today.  Recent Labs: 03/04/2014: BUN 17; Creatinine 0.88; Hemoglobin 16.0; Platelets 266; Potassium 4.4; Sodium 139 03/05/2014: ALT 15    Lipid Panel    Component Value Date/Time   CHOL 175 03/05/2014 0225   TRIG 135 03/05/2014 0225   HDL 38* 03/05/2014 0225    CHOLHDL 4.6 03/05/2014 0225   VLDL 27 03/05/2014 0225   LDLCALC 110* 03/05/2014 0225     Wt Readings from Last 3 Encounters:  05/09/14 171 lb 14.4 oz (77.973 kg)  03/23/14 174 lb 11.2 oz (79.243 kg)  03/13/14 171 lb 11.2 oz (77.883 kg)     Other studies Reviewed: Additional studies/ records that were reviewed today include: telephone notes and last office visit, barium swallow study.  Review of the above records demonstrates: no significant difficulty with swallowing, multiple medication adjustments summarized above.   ASSESSMENT AND PLAN:  1. Diffuse abdominal pain and dysuria: We'll check a urinalysis today. Discussed side effects from medications and advised him that any side effects from the losartan should have resolved as it has been more than a week since he discontinued it.  2. Musculoskeletal pain: He has had no injuries. His symptoms are sharp and brief, not clearly inflammatory. Discussed the possibility that they may be related to stress.  3. Stress: This may be affecting his blood pressure, so will make no medication changes at this time. He will discuss the stress he is under with his primary care physician and consider medication for this.   4. HTN: after sitting and relaxing for few minutes, his blood pressure improved. Therefore, we'll make no medication changes at this time. He is to track his blood pressure at home as he has been doing and followup with Dr. Martinique.  He wishes to continue caring for Ms. Eric Carpenter and does not feel like it is too much for him. Advised him to make sure he continues to eat well, get plenty of rest and continue his daily exercise. Patient states he will do so.  Current medicines are reviewed at length with the patient today.  The patient does not have concerns regarding medicines.  The following changes have been made:  no change  Labs/ tests ordered today include: urinalysis. He was to get a BMET next week, and we will go ahead and obtain  that today.   Orders Placed This Encounter  Procedures  . Urinalysis   Disposition:   FU with Dr Eric Carpenter in 1 month   Signed, Rosaria Ferries, PA-C  05/09/2014 3:56 PM    [facility], [place], [place]  [address] Phone: [phone]; Fax: [phone]

## 2014-05-10 LAB — URINALYSIS, COMPLETE
BILIRUBIN URINE: NEGATIVE
Bacteria, UA: NONE SEEN
Casts: NONE SEEN
Crystals: NONE SEEN
GLUCOSE, UA: NEGATIVE mg/dL
HGB URINE DIPSTICK: NEGATIVE
KETONES UR: NEGATIVE mg/dL
LEUKOCYTES UA: NEGATIVE
Nitrite: NEGATIVE
Protein, ur: NEGATIVE mg/dL
Specific Gravity, Urine: 1.015 (ref 1.005–1.030)
Squamous Epithelial / LPF: NONE SEEN
Urobilinogen, UA: 0.2 mg/dL (ref 0.0–1.0)
pH: 5 (ref 5.0–8.0)

## 2014-05-10 LAB — BASIC METABOLIC PANEL
BUN: 20 mg/dL (ref 6–23)
CALCIUM: 9.4 mg/dL (ref 8.4–10.5)
CO2: 30 mEq/L (ref 19–32)
Chloride: 97 mEq/L (ref 96–112)
Creat: 0.95 mg/dL (ref 0.50–1.35)
Glucose, Bld: 96 mg/dL (ref 70–99)
Potassium: 4 mEq/L (ref 3.5–5.3)
Sodium: 138 mEq/L (ref 135–145)

## 2014-06-18 ENCOUNTER — Ambulatory Visit: Payer: Medicare Other | Admitting: Cardiology

## 2014-06-20 ENCOUNTER — Ambulatory Visit (INDEPENDENT_AMBULATORY_CARE_PROVIDER_SITE_OTHER): Payer: Medicare Other | Admitting: Cardiology

## 2014-06-20 ENCOUNTER — Encounter: Payer: Self-pay | Admitting: Cardiology

## 2014-06-20 VITALS — BP 144/84 | HR 60 | Ht 69.5 in | Wt 174.3 lb

## 2014-06-20 DIAGNOSIS — I714 Abdominal aortic aneurysm, without rupture, unspecified: Secondary | ICD-10-CM

## 2014-06-20 DIAGNOSIS — I2584 Coronary atherosclerosis due to calcified coronary lesion: Secondary | ICD-10-CM

## 2014-06-20 DIAGNOSIS — E785 Hyperlipidemia, unspecified: Secondary | ICD-10-CM | POA: Diagnosis not present

## 2014-06-20 DIAGNOSIS — I2510 Atherosclerotic heart disease of native coronary artery without angina pectoris: Secondary | ICD-10-CM | POA: Diagnosis not present

## 2014-06-20 DIAGNOSIS — I10 Essential (primary) hypertension: Secondary | ICD-10-CM

## 2014-06-20 NOTE — Progress Notes (Signed)
Eric Carpenter Date of Birth: December 03, 1926   History of Present Illness: Eric Carpenter is seen today for followup AAA and HTN. He was admitted in November with back and chest pain. He had a CT abd/pelvis which showed some increase in AAA. This was reviewed by Dr. Donnetta Hutching who felt it was 4.5 cm in diameter. Recommended follow up US in 6 months. Stress Myoview showed inferior attenuation without ischemia. EF was normal.  Over the past 2 months we have made a number of changes in his BP medications due to tolerability issues. He complains of pain in his stomach, groin, and flanks. Sometimes his stomach feels "raw". His losartan was stopped and amlodipine was reduced without improvement in his symptoms. Due to elevated BP his metoprolol was increased and he was started on HCTZ. Follow up BMET was normal. UA was normal. He admits he is under a lot of stress being the caretaker of his wife. He was seen by Dr. Cristina Gong just a week ago and was given a good report.   Current Outpatient Prescriptions on File Prior to Visit  Medication Sig Dispense Refill  . albuterol (PROAIR HFA) 108 (90 BASE) MCG/ACT inhaler Inhale 1-2 puffs into the lungs as needed for wheezing or shortness of breath.    . ALPRAZolam (XANAX) 0.25 MG tablet Take 0.25 mg by mouth as needed.      [name] aspirin 81 MG tablet Take 81 mg by mouth daily.      [name] atorvastatin (LIPITOR) 40 MG tablet Take 40 mg by mouth daily.      . hydrochlorothiazide (HYDRODIURIL) 25 MG tablet Take 1 tablet (25 mg total) by mouth daily. 30 tablet 6  . multivitamin (THERAGRAN) per tablet Take 1 tablet by mouth daily. One-A-Day for Women    . Psyllium (METAMUCIL PO) Take by mouth.     No current facility-administered medications on file prior to visit.    Allergies  Allergen Reactions  . Ciprofloxacin Anxiety  . Cleocin [Clindamycin Hcl] Anxiety  . Neomycin Other (See Comments)    other  . Tobradex [Tobramycin-Dexamethasone]     Opthalmic-burning    Past  Medical History  Diagnosis Date  . Hypertension   . Hyperlipidemia   . PVC's (premature ventricular contractions)   . Premature atrial contraction   . GERD (gastroesophageal reflux disease)   . History of TIAs     Questionable CVA in 1989  . Abdominal aortic aneurysm     a. 03/2013 Abd Duplex: AAA 4.2 x 4.2 cm. b. 02/2014: CTA no dissection; increase in aneurysm to 5.1cm,  . Prostate cancer   . History of measles, mumps, or rubella   . Status post carotid endarterectomy   . Carotid artery occlusion     a. s/p R CEA 1998;  b. 03/2013 U/S: RICA < 60%, LICA 63-01%.  . H/O cardiovascular stress test     a. Nuc 02/2014:  EF 79%, inferior infarction with preserved EF versus diaphragmatic attenuation given normal inferior wall motion, no evidence of ischemia.  [name] PVD (peripheral vascular disease)     a. CT 02/2014: mod stenosis at origins of SMA and L renal artery.  . Hepatic steatosis     a. Hepatic steatosis with probable early cirrhosis on CT 02/2014.    Past Surgical History  Procedure Laterality Date  . Appendectomy    . Tonsillectomy    . Hernia repair    . Prostate surgery    . Total knee arthroplasty  right  . Eye surgery  July 2012    Cataract Bilateral eye  . Eye surgery  Dec. 18, 2013    Right lower eyelid surgery  . Carotid endarterectomy Right 1998    cea    History  Smoking status  . Former Smoker  . Quit date: 04/14/1979  Smokeless tobacco  . Never Used    History  Alcohol Use  . 0.6 - 1.2 oz/week  . 1-2 Glasses of wine per week    Family History  Problem Relation Age of Onset  . Stroke Father   . Heart failure Mother     Review of Systems: As noted in history of present illness.  All other systems were reviewed and are negative.  Physical Exam: BP 144/84 mmHg  Pulse 60  Ht 5' 9.5" (1.765 m)  Wt 174 lb 4.8 oz (79.062 kg)  BMI 25.38 kg/m2 He is a pleasant white male in no acute distress. HEENT exam is normal. Neck is supple no JVD  adenopathy thyromegaly or bruits. He has an old right carotid endarterectomy scar. Lungs are clear. Cardiac exam reveals a regular rate and rhythm  without gallop, murmur, or click. Abdomen is soft and nontender without masses or bruits. Extremities are without edema. He has good pedal pulses. He is alert and oriented x3. Cranial nerves II through XII are intact.   LABORATORY DATA:   Assessment / Plan: 1. Chronic PACs and PVCs. He is on appropriate dose of metoprolol now. Not symptomatic now.   2. Hypertension- controlled. I would continue with current regimen. I don't think his current symptoms are medication related.  3. Hyperlipidemia.  4. Multiple vague abdominal complaints. No post prandial symptoms or bleeding. I think stress is playing a major role in these symptoms.   5. AAA. Follow up with Dr. Donnetta Hutching in May with ultrasound.  6. S/p CEA on right.

## 2014-06-20 NOTE — Patient Instructions (Signed)
Continue your current therapy  I will see you in 6 months.   

## 2014-06-21 ENCOUNTER — Telehealth: Payer: Self-pay | Admitting: Cardiology

## 2014-06-22 NOTE — Telephone Encounter (Signed)
Received call from patient 06/21/14 he stated he is taking amlodipine 5 mg 1/2 tablet daily.Dr.Jordan advised to continue.

## 2014-07-16 ENCOUNTER — Encounter: Payer: Self-pay | Admitting: Vascular Surgery

## 2014-07-17 ENCOUNTER — Ambulatory Visit (INDEPENDENT_AMBULATORY_CARE_PROVIDER_SITE_OTHER): Payer: Medicare Other | Admitting: Vascular Surgery

## 2014-07-17 ENCOUNTER — Encounter: Payer: Self-pay | Admitting: Vascular Surgery

## 2014-07-17 ENCOUNTER — Ambulatory Visit (HOSPITAL_COMMUNITY)
Admission: RE | Admit: 2014-07-17 | Discharge: 2014-07-17 | Disposition: A | Discharge status: Home / Self Care | Payer: Medicare Other | Source: Ambulatory Visit | Attending: Vascular Surgery | Admitting: Vascular Surgery

## 2014-07-17 VITALS — BP 142/75 | HR 59 | Ht 69.5 in | Wt 171.0 lb

## 2014-07-17 DIAGNOSIS — I714 Abdominal aortic aneurysm, without rupture, unspecified: Secondary | ICD-10-CM

## 2014-07-17 DIAGNOSIS — Z48812 Encounter for surgical aftercare following surgery on the circulatory system: Secondary | ICD-10-CM | POA: Diagnosis not present

## 2014-07-17 NOTE — Addendum Note (Signed)
Addended by: Dorthula Rue L on: 07/17/2014 11:41 AM   Modules accepted: Orders

## 2014-07-17 NOTE — Progress Notes (Signed)
Patient presents today for follow-up of known abdominal aortic aneurysm. He remains in excellent health his age of 52. He had several day history of left costal margin and left mid abdominal discomfort. This was not specifically related to bowel movements or positioning. He was concerned this may be related to his aneurysm and requested that we see him sooner. He did have ultrasound schedule for one month for follow-up of his known aneurysm. He has no back pain associated with this. His had no other major medical  Past Medical History  Diagnosis Date  . Hypertension   . Hyperlipidemia   . PVC's (premature ventricular contractions)   . Premature atrial contraction   . GERD (gastroesophageal reflux disease)   . History of TIAs     Questionable CVA in 1989  . Abdominal aortic aneurysm     a. 03/2013 Abd Duplex: AAA 4.2 x 4.2 cm. b. 02/2014: CTA no dissection; increase in aneurysm to 5.1cm,  . Prostate cancer   . History of measles, mumps, or rubella   . Status post carotid endarterectomy   . Carotid artery occlusion     a. s/p R CEA 1998;  b. 03/2013 U/S: RICA < 76%, LICA 28-31%.  . H/O cardiovascular stress test     a. Nuc 02/2014:  EF 79%, inferior infarction with preserved EF versus diaphragmatic attenuation given normal inferior wall motion, no evidence of ischemia.  [name] PVD (peripheral vascular disease)     a. CT 02/2014: mod stenosis at origins of SMA and L renal artery.  . Hepatic steatosis     a. Hepatic steatosis with probable Durante Violett cirrhosis on CT 02/2014.    History  Substance Use Topics  . Smoking status: Former Smoker    Quit date: 04/14/1979  . Smokeless tobacco: Never Used  . Alcohol Use: 0.6 - 1.2 oz/week    1-2 Glasses of wine per week    Family History  Problem Relation Age of Onset  . Stroke Father   . Heart failure Mother     Allergies  Allergen Reactions  . Ciprofloxacin Anxiety  . Cleocin [Clindamycin Hcl] Anxiety  . Neomycin Other (See Comments)   other  . Tobradex [Tobramycin-Dexamethasone]     Opthalmic-burning     Current outpatient prescriptions:  .  albuterol (PROAIR HFA) 108 (90 BASE) MCG/ACT inhaler, Inhale 1-2 puffs into the lungs as needed for wheezing or shortness of breath., Disp: , Rfl:  .  ALPRAZolam (XANAX) 0.25 MG tablet, Take 0.25 mg by mouth as needed.  , Disp: , Rfl:  .  amLODipine (NORVASC) 5 MG tablet, Take 1/2 tablet daily, Disp: 30 tablet, Rfl: 6 .  aspirin 81 MG tablet, Take 81 mg by mouth daily.  , Disp: , Rfl:  .  atorvastatin (LIPITOR) 40 MG tablet, Take 40 mg by mouth daily.  , Disp: , Rfl:  .  hydrochlorothiazide (HYDRODIURIL) 25 MG tablet, Take 1 tablet (25 mg total) by mouth daily., Disp: 30 tablet, Rfl: 6 .  metoprolol succinate (TOPROL-XL) 50 MG 24 hr tablet, Take 75 mg by mouth daily. Take with or immediately following a meal., Disp: , Rfl:  .  multivitamin (THERAGRAN) per tablet, Take 1 tablet by mouth daily. One-A-Day for Women, Disp: , Rfl:  .  Psyllium (METAMUCIL PO), Take by mouth., Disp: , Rfl:   Filed Vitals:   07/17/14 0953 07/17/14 0955  BP: 145/75 142/75  Pulse: 59   Height: 5' 9.5" (1.765 m)   Weight: 171 lb (77.565 kg)  SpO2: 97%     Body mass index is 24.9 kg/(m^2).       Exam a well-developed well-nourished gentleman acute distress Abdomen soft nontender no palpable aneurysm noted Easily palpable femoral and popliteal and dorsalis pedis pulses with no evidence of peripheral aneurysm Respirations are equal in nonlabored  I did review his ultrasound from today. This shows maximal diameter of 4.3 cm. This compares to his most recent ultrasound in our office of 4.2 cm in December 2014. He did have a CT scan roughly 6 months ago showing evidence of a 5.1 cm aneurysm. I'd seen him at that time and when I reviewed the films felt that this was an over call of the diameter and that this was measured on a bias. Ultrasound today seems to confirm that opinion. I explained that his  annual risk of rupture would be negligible. Did explain symptoms of aneurysm leaking and expansion to him and he will report immediately to the emergency room should this occur. Otherwise we'll see him again in one year with repeat ultrasound

## 2014-09-17 ENCOUNTER — Encounter: Payer: Self-pay | Admitting: Vascular Surgery

## 2014-09-18 ENCOUNTER — Ambulatory Visit (HOSPITAL_COMMUNITY)
Admission: RE | Admit: 2014-09-18 | Discharge: 2014-09-18 | Disposition: A | Discharge status: Home / Self Care | Payer: Medicare Other | Source: Ambulatory Visit | Attending: Vascular Surgery | Admitting: Vascular Surgery

## 2014-09-18 ENCOUNTER — Encounter: Payer: Self-pay | Admitting: Vascular Surgery

## 2014-09-18 ENCOUNTER — Ambulatory Visit (INDEPENDENT_AMBULATORY_CARE_PROVIDER_SITE_OTHER): Payer: Medicare Other | Admitting: Vascular Surgery

## 2014-09-18 ENCOUNTER — Other Ambulatory Visit (HOSPITAL_COMMUNITY): Payer: 59

## 2014-09-18 ENCOUNTER — Other Ambulatory Visit: Payer: Self-pay | Admitting: Vascular Surgery

## 2014-09-18 VITALS — BP 162/83 | HR 57 | Resp 16 | Ht 69.5 in | Wt 168.0 lb

## 2014-09-18 DIAGNOSIS — Z48812 Encounter for surgical aftercare following surgery on the circulatory system: Secondary | ICD-10-CM

## 2014-09-18 DIAGNOSIS — I6523 Occlusion and stenosis of bilateral carotid arteries: Secondary | ICD-10-CM

## 2014-09-18 DIAGNOSIS — I714 Abdominal aortic aneurysm, without rupture, unspecified: Secondary | ICD-10-CM

## 2014-09-18 NOTE — Progress Notes (Signed)
Filed Vitals:   09/18/14 1048 09/18/14 1054 09/18/14 1055  BP: 148/84 143/80 162/83  Pulse: 63 56 57  Resp: 16    Height: 5' 9.5" (1.765 m)    Weight: 168 lb (76.204 kg)     Body mass index is 24.46 kg/(m^2).

## 2014-09-18 NOTE — Progress Notes (Signed)
Here today for follow-up of his extracranial cerebrovascular occlusive disease. I had seen him approximately 2 months ago with follow-up of his 4.5 asymptomatic abdominal aortic aneurysm. I apologize in our office has not been able to coordinate both the these studies on the same day and have made assurances that we will correct this for his next follow-up in one year. He has no new medical difficulties. He has some sensation of palpitations in his chest on occasion. Is not dizzy from this. Has had no focal neurologic deficits.  Past Medical History  Diagnosis Date  . Hypertension   . Hyperlipidemia   . PVC's (premature ventricular contractions)   . Premature atrial contraction   . GERD (gastroesophageal reflux disease)   . History of TIAs     Questionable CVA in 1989  . Abdominal aortic aneurysm     a. 03/2013 Abd Duplex: AAA 4.2 x 4.2 cm. b. 02/2014: CTA no dissection; increase in aneurysm to 5.1cm,  . Prostate cancer   . History of measles, mumps, or rubella   . Status post carotid endarterectomy   . Carotid artery occlusion     a. s/p R CEA 1998;  b. 03/2013 U/S: RICA < 14%, LICA 43-15%.  . H/O cardiovascular stress test     a. Nuc 02/2014:  EF 79%, inferior infarction with preserved EF versus diaphragmatic attenuation given normal inferior wall motion, no evidence of ischemia.  [name] PVD (peripheral vascular disease)     a. CT 02/2014: mod stenosis at origins of SMA and L renal artery.  . Hepatic steatosis     a. Hepatic steatosis with probable Tandre Conly cirrhosis on CT 02/2014.    History  Substance Use Topics  . Smoking status: Former Smoker    Quit date: 04/14/1979  . Smokeless tobacco: Never Used  . Alcohol Use: 0.6 - 1.2 oz/week    1-2 Glasses of wine per week    Family History  Problem Relation Age of Onset  . Stroke Father   . Heart failure Mother     Allergies  Allergen Reactions  . Ciprofloxacin Anxiety  . Cleocin [Clindamycin Hcl] Anxiety  . Neomycin Other (See  Comments)    other  . Tobradex [Tobramycin-Dexamethasone]     Opthalmic-burning     Current outpatient prescriptions:  .  albuterol (PROAIR HFA) 108 (90 BASE) MCG/ACT inhaler, Inhale 1-2 puffs into the lungs as needed for wheezing or shortness of breath., Disp: , Rfl:  .  ALPRAZolam (XANAX) 0.25 MG tablet, Take 0.25 mg by mouth as needed.  , Disp: , Rfl:  .  amLODipine (NORVASC) 5 MG tablet, Take 1/2 tablet daily, Disp: 30 tablet, Rfl: 6 .  aspirin 81 MG tablet, Take 81 mg by mouth daily.  , Disp: , Rfl:  .  atorvastatin (LIPITOR) 40 MG tablet, Take 40 mg by mouth daily.  , Disp: , Rfl:  .  hydrochlorothiazide (HYDRODIURIL) 25 MG tablet, Take 1 tablet (25 mg total) by mouth daily., Disp: 30 tablet, Rfl: 6 .  metoprolol succinate (TOPROL-XL) 50 MG 24 hr tablet, Take 75 mg by mouth daily. Take with or immediately following a meal., Disp: , Rfl:  .  multivitamin (THERAGRAN) per tablet, Take 1 tablet by mouth daily. One-A-Day for Women, Disp: , Rfl:  .  Psyllium (METAMUCIL PO), Take by mouth., Disp: , Rfl:   Filed Vitals:   09/18/14 1048 09/18/14 1054 09/18/14 1055  BP: 148/84 143/80 162/83  Pulse: 63 56 57  Resp: 16  Height: 5' 9.5" (1.765 m)    Weight: 168 lb (76.204 kg)      Body mass index is 24.46 kg/(m^2).        On physical exam : Well-developed well-nourished gentleman appearing younger than stated age of 70  Grossly intact neurologically  No carotid bruits in his right or left neck   reviewed his duplex with the patient from today. This shows no evidence of restenosis in his right carotid endarterectomy. Moderate stenosis in the left in the 50-69% range.   Pression and plan asymptomatic carotid stenosis. We'll see him again in one year with repeat carotid duplex. We'll also coordinate follow-up of his ultrasound of his infrarenal aneurysm at the same date.

## 2014-09-19 NOTE — Addendum Note (Signed)
Addended by: Dorthula Rue L on: 09/19/2014 04:50 PM   Modules accepted: Orders

## 2014-10-10 ENCOUNTER — Ambulatory Visit: Payer: Medicare Other | Admitting: Cardiology

## 2014-12-14 ENCOUNTER — Ambulatory Visit: Payer: Medicare Other | Admitting: Cardiology

## 2015-01-10 ENCOUNTER — Telehealth: Payer: Self-pay | Admitting: Cardiology

## 2015-01-10 MED ORDER — METOPROLOL SUCCINATE ER 50 MG PO TB24: ORAL_TABLET | ORAL | Status: DC

## 2015-01-10 NOTE — Telephone Encounter (Signed)
Returned call to patient.He stated he needed a prior authorization for metoprolol.Spoke to pharmacist at [facility] she stated pt only needs a refill for metoprolol.Refill phoned in.

## 2015-01-10 NOTE — Telephone Encounter (Signed)
Patient wants to know if we received the prior auth for his Metoprolol succ and if we did has it been sent to the pharmacy---he is almost out of medication.  Please call asap.

## 2015-01-10 NOTE — Telephone Encounter (Signed)
Returned call to patient no answer.LMTC. 

## 2015-07-23 ENCOUNTER — Other Ambulatory Visit (HOSPITAL_COMMUNITY): Payer: Medicare Other

## 2015-07-23 ENCOUNTER — Ambulatory Visit: Payer: PRIVATE HEALTH INSURANCE | Admitting: Vascular Surgery

## 2015-07-25 ENCOUNTER — Other Ambulatory Visit: Payer: Self-pay | Admitting: *Deleted

## 2015-07-25 MED ORDER — AMLODIPINE BESYLATE 5 MG PO TABS: ORAL_TABLET | ORAL | Status: DC

## 2015-07-29 ENCOUNTER — Telehealth: Payer: Self-pay | Admitting: Cardiology

## 2015-07-29 NOTE — Telephone Encounter (Signed)
07/29/2015  Received referral packet from [place] for upcoming appointment with Dr. Martinique on October 21, 2015.  Records given to [facility].  cbr

## 2015-09-06 ENCOUNTER — Other Ambulatory Visit: Payer: Self-pay | Admitting: Cardiology

## 2015-09-06 MED ORDER — METOPROLOL SUCCINATE ER 50 MG PO TB24: ORAL_TABLET | ORAL | Status: DC

## 2015-09-06 NOTE — Telephone Encounter (Signed)
Rx(s) sent to pharmacy electronically.  

## 2015-09-19 ENCOUNTER — Encounter: Payer: Self-pay | Admitting: Vascular Surgery

## 2015-09-23 ENCOUNTER — Encounter: Payer: Self-pay | Admitting: Vascular Surgery

## 2015-09-24 ENCOUNTER — Ambulatory Visit: Payer: Medicare Other | Admitting: Vascular Surgery

## 2015-09-24 ENCOUNTER — Ambulatory Visit (INDEPENDENT_AMBULATORY_CARE_PROVIDER_SITE_OTHER)
Admission: RE | Admit: 2015-09-24 | Discharge: 2015-09-24 | Disposition: A | Discharge status: Home / Self Care | Payer: Medicare Other | Source: Ambulatory Visit | Attending: Vascular Surgery | Admitting: Vascular Surgery

## 2015-09-24 ENCOUNTER — Ambulatory Visit (HOSPITAL_COMMUNITY)
Admission: RE | Admit: 2015-09-24 | Discharge: 2015-09-24 | Disposition: A | Discharge status: Home / Self Care | Payer: Medicare Other | Source: Ambulatory Visit | Attending: Vascular Surgery | Admitting: Vascular Surgery

## 2015-09-24 DIAGNOSIS — I6523 Occlusion and stenosis of bilateral carotid arteries: Secondary | ICD-10-CM | POA: Insufficient documentation

## 2015-09-24 DIAGNOSIS — E785 Hyperlipidemia, unspecified: Secondary | ICD-10-CM | POA: Insufficient documentation

## 2015-09-24 DIAGNOSIS — Z48812 Encounter for surgical aftercare following surgery on the circulatory system: Secondary | ICD-10-CM

## 2015-09-24 DIAGNOSIS — I10 Essential (primary) hypertension: Secondary | ICD-10-CM | POA: Insufficient documentation

## 2015-09-24 DIAGNOSIS — K219 Gastro-esophageal reflux disease without esophagitis: Secondary | ICD-10-CM | POA: Diagnosis not present

## 2015-09-24 DIAGNOSIS — I714 Abdominal aortic aneurysm, without rupture, unspecified: Secondary | ICD-10-CM

## 2015-10-01 ENCOUNTER — Encounter: Payer: Self-pay | Admitting: Vascular Surgery

## 2015-10-01 ENCOUNTER — Ambulatory Visit (INDEPENDENT_AMBULATORY_CARE_PROVIDER_SITE_OTHER): Payer: Medicare Other | Admitting: Vascular Surgery

## 2015-10-01 VITALS — BP 148/92 | HR 71 | Temp 97.8°F | Resp 16 | Ht 69.5 in | Wt 168.0 lb

## 2015-10-01 DIAGNOSIS — I6523 Occlusion and stenosis of bilateral carotid arteries: Secondary | ICD-10-CM | POA: Diagnosis not present

## 2015-10-01 DIAGNOSIS — I714 Abdominal aortic aneurysm, without rupture, unspecified: Secondary | ICD-10-CM

## 2015-10-01 NOTE — Progress Notes (Signed)
Filed Vitals:   10/01/15 1414 10/01/15 1416 10/01/15 1418  BP: 137/80 151/99 148/92  Pulse: 70 71 71  Temp: 97.8 F (36.6 C)    Resp: 16    Height: 5' 9.5" (1.765 m)    Weight: 168 lb (76.204 kg)    SpO2: 95%

## 2015-10-01 NOTE — Progress Notes (Signed)
Vascular and Vein Specialist of [facility]  Patient name: Eric Carpenter MRN: [id number] DOB: Sep 02, 1926 Sex: male  REASON FOR VISIT: Follow-up  HPI: Eric Carpenter is a 82 y.o. male who presents for continued follow-up of his carotid stenosis and infrarenal abdominal aorta. He has a remote history of right carotid endarterectomy. The patient denies any episodes of amaurosis fugax, sudden onset weakness or numbness of the extremities, expressive or receptive aphasia. The patient denies any abdominal or back pain.  The patient is doing well. He is very active and ambulates 2 miles a day. He is currently caring for his significant other who is suffering from dementia. He states that this is very stressful. He is concerned that stress we'll have an effect on his aneurysm.  The patient is a former smoker. His hypertension is managed on amlodipine, metoprolol and HCTZ. He takes a daily aspirin. He is on a statin for hyperlipidemia.  Past Medical History  Diagnosis Date  . Hypertension   . Hyperlipidemia   . PVC's (premature ventricular contractions)   . Premature atrial contraction   . GERD (gastroesophageal reflux disease)   . History of TIAs     Questionable CVA in 1989  . Abdominal aortic aneurysm ([place])     a. 03/2013 Abd Duplex: AAA 4.2 x 4.2 cm. b. 02/2014: CTA no dissection; increase in aneurysm to 5.1cm,  . Prostate cancer ([place])   . History of measles, mumps, or rubella   . Status post carotid endarterectomy   . Carotid artery occlusion     a. s/p R CEA 1998;  b. 03/2013 U/S: RICA < [id number], LICA 123456.  . H/O cardiovascular stress test     a. Nuc 02/2014:  EF 79%, inferior infarction with preserved EF versus diaphragmatic attenuation given normal inferior wall motion, no evidence of ischemia.  [name] PVD (peripheral vascular disease) ([place])     a. CT 02/2014: mod stenosis at origins of SMA and L renal artery.  . Hepatic steatosis     a. Hepatic steatosis with probable [address] [place]  cirrhosis on CT 02/2014.    Family History  Problem Relation Age of Onset  . Stroke Father   . Heart failure Mother     SOCIAL HISTORY: Social History  Substance Use Topics  . Smoking status: Former Smoker    Quit date: 04/14/1979  . Smokeless tobacco: Never Used  . Alcohol Use: 0.6 - 1.2 oz/week    1-2 Glasses of wine per week    Allergies  Allergen Reactions  . Ciprofloxacin Anxiety and Other (See Comments)    Per patient "bad sense of well being"; can take if absolutely necessary   . Cleocin [Clindamycin Hcl] Anxiety  . Tobradex [Tobramycin-Dexamethasone] Other (See Comments)    Opthalmic-burning Opthalmic-burning  . Neomycin Other (See Comments)    other  . Clindamycin Other (See Comments) and Rash    Current Outpatient Prescriptions  Medication Sig Dispense Refill  . albuterol (PROAIR HFA) 108 (90 BASE) MCG/ACT inhaler Inhale 1-2 puffs into the lungs as needed for wheezing or shortness of breath.    . ALPRAZolam (XANAX) 0.25 MG tablet Take 0.25 mg by mouth as needed.      [name] amLODipine (NORVASC) 5 MG tablet Take 1/2 tablet daily 30 tablet 6  . aspirin 81 MG tablet Take 81 mg by mouth daily.      [name] atorvastatin (LIPITOR) 40 MG tablet Take 40 mg by mouth daily.      . hydrochlorothiazide (HYDRODIURIL) 25  MG tablet Take 1 tablet (25 mg total) by mouth daily. 30 tablet 6  . metoprolol succinate (TOPROL-XL) 50 MG 24 hr tablet Take 1 & 1/2 tablets by mouth daily 45 tablet 1  . multivitamin (THERAGRAN) per tablet Take 1 tablet by mouth daily. One-A-Day for Women    . Psyllium (METAMUCIL PO) Take by mouth.     No current facility-administered medications for this visit.    REVIEW OF SYSTEMS:  [X]  denotes positive finding, [ ]  denotes negative finding Cardiac  Comments:  Chest pain or chest pressure:    Shortness of breath upon exertion:    Short of breath when lying flat:    Irregular heart rhythm: x       Vascular    Pain in calf, thigh, or hip brought on by  ambulation:    Pain in feet at night that wakes you up from your sleep:     Blood clot in your veins:    Leg swelling:         Pulmonary    Oxygen at home:    Productive cough:     Wheezing:         Neurologic    Sudden weakness in arms or legs:     Sudden numbness in arms or legs:     Sudden onset of difficulty speaking or slurred speech:    Temporary loss of vision in one eye:     Problems with dizziness:         Gastrointestinal    Blood in stool:     Vomited blood:         Genitourinary    Burning when urinating:     Blood in urine:        Psychiatric    Major depression:         Hematologic    Bleeding problems:    Problems with blood clotting too easily:        Skin    Rashes or ulcers:        Constitutional    Fever or chills:      PHYSICAL EXAM: Filed Vitals:   10/01/15 1414 10/01/15 1416 10/01/15 1418  BP: 137/80 151/99 148/92  Pulse: 70 71 71  Temp: 97.8 F (36.6 C)    Resp: 16    Height: 5' 9.5" (1.765 m)    Weight: 168 lb (76.204 kg)    SpO2: 95%      GENERAL: The patient is a well-nourished male, appears younger than stated age, in no acute distress. The vital signs are documented above. CARDIAC: There is a regular rate and rhythm. No carotid bruits. VASCULAR: 2+ femoral and dorsalis pedis pulses bilaterally.  PULMONARY: There is good air exchange bilaterally without wheezing or rales. ABDOMEN: Soft and non-tender.   MUSCULOSKELETAL: There are no major deformities or cyanosis. NEUROLOGIC: No focal weakness or paresthesias are detected. SKIN: There are no ulcers or rashes noted. PSYCHIATRIC: The patient has a normal affect.  DATA:  Carotid duplex 09/24/2015  The right carotid endarterectomy site is patent with evidence of mild hyperplasia at the distal patch site. Left internal carotid artery stenosis 40-59%. Vertebral arteries are patent with antegrade flow bilaterally.  AAA duplex 09/24/2015  Maximal diameter of 4.5 x 4.7,  previously 4.1 x 4.28 on 07/17/2014  MEDICAL ISSUES: Carotid stenosis  The patient has been asymptomatic. His right carotid endarterectomy site is patent. He is on maximal medical management with aspirin and a statin. He is  a former smoker. He will follow-up in 1 year with repeat carotid duplex.  Abdominal aortic aneurysm without rupture  There is been a mild increase in the maximal diameter compared to his exam 1 year ago. He is currently 4.5 x 4.7 cm. Advised routine hypertension control. Discussed that his current stress does not affect the growth of his aneurysm. He will follow-up in 1 year with a AAA duplex. If his aneurysm continues to grow and is above 5 cm, would recommend CT scan to evaluate for stent graft repair.   Virgina Jock, PA-C [facility]ists of [place]     I have examined the patient, reviewed and agree with above.  Curt Jews, MD 10/01/2015 3:31 PM

## 2015-10-21 ENCOUNTER — Encounter: Payer: Self-pay | Admitting: Cardiology

## 2015-10-21 ENCOUNTER — Ambulatory Visit (INDEPENDENT_AMBULATORY_CARE_PROVIDER_SITE_OTHER): Payer: Medicare Other | Admitting: Cardiology

## 2015-10-21 VITALS — BP 122/84 | HR 58

## 2015-10-21 DIAGNOSIS — I6523 Occlusion and stenosis of bilateral carotid arteries: Secondary | ICD-10-CM

## 2015-10-21 DIAGNOSIS — I493 Ventricular premature depolarization: Secondary | ICD-10-CM

## 2015-10-21 DIAGNOSIS — I714 Abdominal aortic aneurysm, without rupture, unspecified: Secondary | ICD-10-CM

## 2015-10-21 DIAGNOSIS — I10 Essential (primary) hypertension: Secondary | ICD-10-CM | POA: Diagnosis not present

## 2015-10-21 DIAGNOSIS — Z9889 Other specified postprocedural states: Secondary | ICD-10-CM | POA: Diagnosis not present

## 2015-10-21 NOTE — Progress Notes (Signed)
Eric Carpenter Date of Birth: Sep 10, 1926   History of Present Illness: Eric Carpenter is seen today for followup AAA and HTN. He was admitted in November with back and chest pain. He had a CT abd/pelvis which showed some increase in AAA. This was reviewed by Dr. Donnetta Hutching who felt it was 4.5 cm in diameter.Follow up in June 2017 showed size of 4.5 x 4.7 cm. Follow up in one year.  Stress Myoview in November 2015 showed inferior attenuation without ischemia. EF was normal.  Carotid dopplers showed 123456 LICA stenosis and patent RCEA.  Last year we made a number of changes in his BP medications due to tolerability issues.  His losartan was stopped due to dizziness and amlodipine was reduced without improvement in his symptoms. He complains of pain all over including his feet and head. He loses his balance easily especially in the dark. Due to elevated BP his metoprolol was increased and he was started on HCTZ. He  is under a lot of stress being the caretaker of his wife.    Current Outpatient Prescriptions on File Prior to Visit  Medication Sig Dispense Refill  . albuterol (PROAIR HFA) 108 (90 BASE) MCG/ACT inhaler Inhale 1-2 puffs into the lungs as needed for wheezing or shortness of breath.    . ALPRAZolam (XANAX) 0.25 MG tablet Take 0.25 mg by mouth as needed.      [name] amLODipine (NORVASC) 5 MG tablet Take 1/2 tablet daily 30 tablet 6  . aspirin 81 MG tablet Take 81 mg by mouth daily.      [name] atorvastatin (LIPITOR) 40 MG tablet Take 40 mg by mouth daily.      . hydrochlorothiazide (HYDRODIURIL) 25 MG tablet Take 1 tablet (25 mg total) by mouth daily. 30 tablet 6  . metoprolol succinate (TOPROL-XL) 50 MG 24 hr tablet Take 1 & 1/2 tablets by mouth daily 45 tablet 1  . multivitamin (THERAGRAN) per tablet Take 1 tablet by mouth daily. One-A-Day for Women    . Psyllium (METAMUCIL PO) Take by mouth.     No current facility-administered medications on file prior to visit.    Allergies  Allergen  Reactions  . Ciprofloxacin Anxiety and Other (See Comments)    Per patient "bad sense of well being"; can take if absolutely necessary   . Cleocin [Clindamycin Hcl] Anxiety  . Tobradex [Tobramycin-Dexamethasone] Other (See Comments)    Opthalmic-burning Opthalmic-burning  . Neomycin Other (See Comments)    other  . Clindamycin Other (See Comments) and Rash    Past Medical History  Diagnosis Date  . Hypertension   . Hyperlipidemia   . PVC's (premature ventricular contractions)   . Premature atrial contraction   . GERD (gastroesophageal reflux disease)   . History of TIAs     Questionable CVA in 1989  . Abdominal aortic aneurysm ([place])     a. 03/2013 Abd Duplex: AAA 4.2 x 4.2 cm. b. 02/2014: CTA no dissection; increase in aneurysm to 5.1cm,  . Prostate cancer ([place])   . History of measles, mumps, or rubella   . Status post carotid endarterectomy   . Carotid artery occlusion     a. s/p R CEA 1998;  b. 03/2013 U/S: RICA < [id number], LICA 123456.  . H/O cardiovascular stress test     a. Nuc 02/2014:  EF 79%, inferior infarction with preserved EF versus diaphragmatic attenuation given normal inferior wall motion, no evidence of ischemia.  [name] PVD (peripheral vascular disease) ([place])  a. CT 02/2014: mod stenosis at origins of SMA and L renal artery.  . Hepatic steatosis     a. Hepatic steatosis with probable early cirrhosis on CT 02/2014.    Past Surgical History  Procedure Laterality Date  . Appendectomy    . Tonsillectomy    . Hernia repair    . Prostate surgery    . Total knee arthroplasty      right  . Eye surgery  July 2012    Cataract Bilateral eye  . Eye surgery  Dec. 18, 2013    Right lower eyelid surgery  . Carotid endarterectomy Right 1998    cea    History  Smoking status  . Former Smoker  . Quit date: 04/14/1979  Smokeless tobacco  . Never Used    History  Alcohol Use  . 0.6 - 1.2 oz/week  . 1-2 Glasses of wine per week    Family History  Problem  Relation Age of Onset  . Stroke Father   . Heart failure Mother     Review of Systems: As noted in history of present illness.  All other systems were reviewed and are negative.  Physical Exam: There were no vitals taken for this visit. He is a pleasant white male in no acute distress. HEENT exam is normal. Neck is supple no JVD adenopathy thyromegaly or bruits. He has an old right carotid endarterectomy scar. Lungs are clear. Cardiac exam reveals a regular rate and rhythm  without gallop, murmur, or click. Abdomen is soft and nontender without masses or bruits. Extremities are without edema. He has good pedal pulses. He is alert and oriented x3. Cranial nerves II through XII are intact.   LABORATORY DATA: Ecg today shows NSR with first degree AV block. Otherwise normal. I have personally reviewed and interpreted this study.   Assessment / Plan: 1. Chronic PACs and PVCs. He is on appropriate dose of metoprolol now. Not symptomatic now.   2. Hypertension- under fair control.  I would continue with current regimen. I don't think his multiple symptoms  are medication related.  3. Hyperlipidemia.  4. AAA. 4.5 x 4.7 cm. Followed by Dr. Donnetta Hutching.  5. S/p CEA on right. patent

## 2015-10-21 NOTE — Patient Instructions (Signed)
Continue your current therapy  I will see you in one year   

## 2016-01-15 ENCOUNTER — Other Ambulatory Visit: Payer: Self-pay | Admitting: *Deleted

## 2016-01-15 MED ORDER — METOPROLOL SUCCINATE ER 50 MG PO TB24: ORAL_TABLET | ORAL | 11 refills | Status: AC

## 2016-01-15 NOTE — Telephone Encounter (Signed)
Rx request sent to pharmacy.  

## 2016-07-23 ENCOUNTER — Encounter: Payer: Self-pay | Admitting: Vascular Surgery

## 2016-07-28 ENCOUNTER — Other Ambulatory Visit: Payer: Self-pay | Admitting: Vascular Surgery

## 2016-07-28 DIAGNOSIS — I6529 Occlusion and stenosis of unspecified carotid artery: Secondary | ICD-10-CM

## 2016-07-28 DIAGNOSIS — I700 Atherosclerosis of aorta: Secondary | ICD-10-CM

## 2016-08-03 ENCOUNTER — Ambulatory Visit (HOSPITAL_COMMUNITY)
Admission: RE | Admit: 2016-08-03 | Discharge: 2016-08-03 | Disposition: A | Discharge status: Home / Self Care | Payer: Medicare Other | Source: Ambulatory Visit | Attending: Surgery | Admitting: Surgery

## 2016-08-03 ENCOUNTER — Ambulatory Visit (INDEPENDENT_AMBULATORY_CARE_PROVIDER_SITE_OTHER)
Admission: RE | Admit: 2016-08-03 | Discharge: 2016-08-03 | Disposition: A | Discharge status: Home / Self Care | Payer: Medicare Other | Source: Ambulatory Visit | Attending: Surgery | Admitting: Surgery

## 2016-08-03 DIAGNOSIS — I714 Abdominal aortic aneurysm, without rupture: Secondary | ICD-10-CM | POA: Insufficient documentation

## 2016-08-03 DIAGNOSIS — I700 Atherosclerosis of aorta: Secondary | ICD-10-CM

## 2016-08-03 DIAGNOSIS — I6529 Occlusion and stenosis of unspecified carotid artery: Secondary | ICD-10-CM

## 2016-08-03 LAB — VAS US CAROTID
LCCADDIAS: -19 cm/s
LCCADSYS: -96 cm/s
LCCAPDIAS: 16 cm/s
LCCAPSYS: 81 cm/s
LEFT ECA DIAS: -9 cm/s
LEFT VERTEBRAL DIAS: 14 cm/s
LICADDIAS: -28 cm/s
LICADSYS: -90 cm/s
Left ICA prox dias: 52 cm/s
Left ICA prox sys: 175 cm/s
RIGHT CCA MID DIAS: -18 cm/s
RIGHT ECA DIAS: 2 cm/s
RIGHT VERTEBRAL DIAS: -12 cm/s
Right CCA prox dias: 20 cm/s
Right CCA prox sys: 108 cm/s
Right cca dist sys: -70 cm/s

## 2016-08-04 ENCOUNTER — Ambulatory Visit (INDEPENDENT_AMBULATORY_CARE_PROVIDER_SITE_OTHER): Payer: Medicare Other | Admitting: Vascular Surgery

## 2016-08-04 ENCOUNTER — Encounter: Payer: Self-pay | Admitting: Vascular Surgery

## 2016-08-04 VITALS — BP 127/75 | HR 69 | Temp 97.0°F | Resp 16 | Ht 69.5 in | Wt 164.0 lb

## 2016-08-04 DIAGNOSIS — I6523 Occlusion and stenosis of bilateral carotid arteries: Secondary | ICD-10-CM

## 2016-08-04 DIAGNOSIS — I714 Abdominal aortic aneurysm, without rupture, unspecified: Secondary | ICD-10-CM

## 2016-08-04 NOTE — Progress Notes (Signed)
Vascular and Vein Specialist of [place]  Patient name: Eric Carpenter MRN: [id number] DOB: 1926/11/20 Sex: male  REASON FOR VISIT: Follow-up carotid artery disease and asymptomatic abdominal aortic aneurysm  HPI: Eric Carpenter is a 83 y.o. male here today for follow-up. He continues to remain active. He walks 1-2 miles per day. He reports that he has slowing down some regarding mowing and other activities. He is a primary caregiver for a long-term significant other who has progressive dementia and other medical issues. He specifically denies any focal neurologic deficits. Has no symptoms referable to his aneurysm.  Past Medical History:  Diagnosis Date  . Abdominal aortic aneurysm ([place])    a. 03/2013 Abd Duplex: AAA 4.2 x 4.2 cm. b. 02/2014: CTA no dissection; increase in aneurysm to 5.1cm,  . Carotid artery occlusion    a. s/p R CEA 1998;  b. 03/2013 U/S: RICA < 70%, LICA 35-00%.  [name] GERD (gastroesophageal reflux disease)   . H/O cardiovascular stress test    a. Nuc 02/2014:  EF 79%, inferior infarction with preserved EF versus diaphragmatic attenuation given normal inferior wall motion, no evidence of ischemia.  . Hepatic steatosis    a. Hepatic steatosis with probable [place] cirrhosis on CT 02/2014.  [name] History of measles, mumps, or rubella   . History of TIAs    Questionable CVA in 1989  . Hyperlipidemia   . Hypertension   . Premature atrial contraction   . Prostate cancer ([place])   . PVC's (premature ventricular contractions)   . PVD (peripheral vascular disease) ([place])    a. CT 02/2014: mod stenosis at origins of SMA and L renal artery.  . Status post carotid endarterectomy     Family History  Problem Relation Age of Onset  . Stroke Father   . Heart failure Mother     SOCIAL HISTORY: Social History  Substance Use Topics  . Smoking status: Former Smoker    Quit date: 04/14/1979  . Smokeless tobacco: Never Used  . Alcohol use  0.6 - 1.2 oz/week    1 - 2 Glasses of wine per week    Allergies  Allergen Reactions  . Ciprofloxacin Anxiety and Other (See Comments)    Per patient "bad sense of well being"; can take if absolutely necessary   . Cleocin [Clindamycin Hcl] Anxiety  . Tobradex [Tobramycin-Dexamethasone] Other (See Comments)    Opthalmic-burning Opthalmic-burning  . Neomycin Other (See Comments)    other  . Clindamycin Other (See Comments) and Rash    Current Outpatient Prescriptions  Medication Sig Dispense Refill  . ALPRAZolam (XANAX) 0.25 MG tablet Take 0.25 mg by mouth as needed.      [name] atorvastatin (LIPITOR) 40 MG tablet Take 40 mg by mouth daily.      . hydrochlorothiazide (HYDRODIURIL) 25 MG tablet Take 1 tablet (25 mg total) by mouth daily. 30 tablet 6  . metoprolol succinate (TOPROL-XL) 50 MG 24 hr tablet Take 1 & 1/2 tablets by mouth daily 45 tablet 11   No current facility-administered medications for this visit.     REVIEW OF SYSTEMS:  [X]  denotes positive finding, [ ]  denotes negative finding Cardiac  Comments:  Chest pain or chest pressure:    Shortness of breath upon exertion:    Short of breath when lying flat:    Irregular heart rhythm:        Vascular    Pain in calf, thigh, or hip brought on by ambulation:  Pain in feet at night that wakes you up from your sleep:     Blood clot in your veins:    Leg swelling:           PHYSICAL EXAM: Vitals:   08/04/16 0956 08/04/16 1003  BP: (!) 141/77 127/75  Pulse: 69   Resp: 16   Temp: 97 F (36.1 C)   TempSrc: Oral   SpO2: 94%   Weight: 164 lb (74.4 kg)   Height: 5' 9.5" (1.765 m)     GENERAL: The patient is a well-nourished male, in no acute distress. The vital signs are documented above. CARDIOVASCULAR: 2+ radial 2+ femoral pulses 2+ popliteal pulses without popliteal artery aneurysms. Abdomen easily palpable aneurysm which is nontender. PULMONARY: There is good air exchange  MUSCULOSKELETAL: There are no major  deformities or cyanosis. NEUROLOGIC: No focal weakness or paresthesias are detected. SKIN: There are no ulcers or rashes noted. PSYCHIATRIC: The patient has a normal affect.  DATA:  Carotid duplex reveals widely patent endarterectomy on the right and mild 40-59% stenosis in the left.  Duplex of his aorta shows a continued slow increase in size now up to 5.3 cm with a maximal diameter in June 2017 predicted at 4.8 cm  MEDICAL ISSUES: Had long discussion regarding this. I explained that he is really at the threshold where we would consider elective repair. I looked back at his CT scan from November 2015. At that time it appeared that he would've been a stent stent graft candidate. I have recommended that we see him again in 6 months with CT scan at that time. He is comfortable with this recommendation will see [place] again the    Rosetta Posner, MD [facility] Specialists of [facility] Tel [phone] Pager [phone]

## 2016-08-05 NOTE — Addendum Note (Signed)
Addended by: Lianne Cure A on: 08/05/2016 09:18 AM   Modules accepted: Orders

## 2016-09-03 ENCOUNTER — Encounter (HOSPITAL_COMMUNITY): Payer: Medicare Other

## 2016-09-03 ENCOUNTER — Other Ambulatory Visit (HOSPITAL_COMMUNITY): Payer: Medicare Other

## 2016-09-08 ENCOUNTER — Ambulatory Visit: Payer: Medicare Other | Admitting: Vascular Surgery

## 2016-10-06 ENCOUNTER — Encounter (HOSPITAL_COMMUNITY): Payer: Medicare Other

## 2016-10-06 ENCOUNTER — Ambulatory Visit: Payer: Medicare Other | Admitting: Vascular Surgery

## 2016-10-06 ENCOUNTER — Other Ambulatory Visit (HOSPITAL_COMMUNITY): Payer: Medicare Other

## 2016-10-20 ENCOUNTER — Ambulatory Visit: Payer: Medicare Other | Admitting: Vascular Surgery

## 2016-10-20 ENCOUNTER — Other Ambulatory Visit (HOSPITAL_COMMUNITY): Payer: Medicare Other

## 2016-10-20 ENCOUNTER — Encounter (HOSPITAL_COMMUNITY): Payer: Medicare Other

## 2016-10-21 ENCOUNTER — Encounter (HOSPITAL_COMMUNITY): Payer: Self-pay | Admitting: Emergency Medicine

## 2016-10-21 ENCOUNTER — Emergency Department (HOSPITAL_COMMUNITY): Payer: Medicare Other

## 2016-10-21 ENCOUNTER — Observation Stay (HOSPITAL_COMMUNITY)
Admission: EM | Admit: 2016-10-21 | Discharge: 2016-10-23 | Disposition: A | Discharge status: Home / Self Care | Payer: Medicare Other | Attending: Internal Medicine | Admitting: Internal Medicine

## 2016-10-21 DIAGNOSIS — I10 Essential (primary) hypertension: Secondary | ICD-10-CM | POA: Diagnosis not present

## 2016-10-21 DIAGNOSIS — R0789 Other chest pain: Secondary | ICD-10-CM | POA: Diagnosis not present

## 2016-10-21 DIAGNOSIS — I714 Abdominal aortic aneurysm, without rupture, unspecified: Secondary | ICD-10-CM

## 2016-10-21 DIAGNOSIS — Z96651 Presence of right artificial knee joint: Secondary | ICD-10-CM | POA: Diagnosis not present

## 2016-10-21 DIAGNOSIS — Z79899 Other long term (current) drug therapy: Secondary | ICD-10-CM | POA: Insufficient documentation

## 2016-10-21 DIAGNOSIS — Z8673 Personal history of transient ischemic attack (TIA), and cerebral infarction without residual deficits: Secondary | ICD-10-CM | POA: Diagnosis not present

## 2016-10-21 DIAGNOSIS — I2584 Coronary atherosclerosis due to calcified coronary lesion: Secondary | ICD-10-CM

## 2016-10-21 DIAGNOSIS — Z87891 Personal history of nicotine dependence: Secondary | ICD-10-CM | POA: Diagnosis not present

## 2016-10-21 DIAGNOSIS — R079 Chest pain, unspecified: Secondary | ICD-10-CM | POA: Diagnosis present

## 2016-10-21 DIAGNOSIS — I739 Peripheral vascular disease, unspecified: Secondary | ICD-10-CM | POA: Diagnosis present

## 2016-10-21 DIAGNOSIS — I2510 Atherosclerotic heart disease of native coronary artery without angina pectoris: Secondary | ICD-10-CM | POA: Diagnosis present

## 2016-10-21 DIAGNOSIS — E785 Hyperlipidemia, unspecified: Secondary | ICD-10-CM | POA: Diagnosis present

## 2016-10-21 HISTORY — DX: Abdominal aortic aneurysm, without rupture: I71.4

## 2016-10-21 HISTORY — DX: Chest pain, unspecified: R07.9

## 2016-10-21 LAB — CBC
HCT: 46.6 % (ref 39.0–52.0)
HEMOGLOBIN: 15.1 g/dL (ref 13.0–17.0)
MCH: 29.4 pg (ref 26.0–34.0)
MCHC: 32.4 g/dL (ref 30.0–36.0)
MCV: 90.8 fL (ref 78.0–100.0)
Platelets: 245 10*3/uL (ref 150–400)
RBC: 5.13 MIL/uL (ref 4.22–5.81)
RDW: 12.9 % (ref 11.5–15.5)
WBC: 8.6 10*3/uL (ref 4.0–10.5)

## 2016-10-21 LAB — I-STAT TROPONIN, ED: Troponin i, poc: 0.01 ng/mL (ref 0.00–0.08)

## 2016-10-21 LAB — BASIC METABOLIC PANEL
ANION GAP: 7 (ref 5–15)
BUN: 15 mg/dL (ref 6–20)
CO2: 28 mmol/L (ref 22–32)
Calcium: 9.1 mg/dL (ref 8.9–10.3)
Chloride: 102 mmol/L (ref 101–111)
Creatinine, Ser: 1.1 mg/dL (ref 0.61–1.24)
GFR calc Af Amer: >60 mL/min (ref >60)
GFR, EST NON AFRICAN AMERICAN: 58 mL/min — AB (ref >60)
Glucose, Bld: 98 mg/dL (ref 65–99)
POTASSIUM: 3.8 mmol/L (ref 3.5–5.1)
SODIUM: 137 mmol/L (ref 135–145)

## 2016-10-21 LAB — TROPONIN I: Troponin I: <0.03 ng/mL (ref <0.03)

## 2016-10-21 MED ORDER — ACETAMINOPHEN 325 MG PO TABS: 650.0000 mg | ORAL_TABLET | ORAL | Status: DC | PRN

## 2016-10-21 MED ORDER — HYDROCHLOROTHIAZIDE 25 MG PO TABS
25.0000 mg | ORAL_TABLET | Freq: Every day | ORAL | Status: DC
  Administered 2016-10-22 – 2016-10-23 (×2): 25 mg via ORAL
  Filled 2016-10-21 (×2): qty 1

## 2016-10-21 MED ORDER — ASPIRIN 81 MG PO CHEW
324.0000 mg | CHEWABLE_TABLET | Freq: Once | ORAL | Status: AC
  Administered 2016-10-21: 324 mg via ORAL
  Filled 2016-10-21: qty 4

## 2016-10-21 MED ORDER — ATORVASTATIN CALCIUM 40 MG PO TABS
40.0000 mg | ORAL_TABLET | Freq: Every day | ORAL | Status: DC
  Administered 2016-10-22 – 2016-10-23 (×2): 40 mg via ORAL
  Filled 2016-10-21 (×2): qty 1

## 2016-10-21 MED ORDER — ONDANSETRON HCL 4 MG/2ML IJ SOLN: 4.0000 mg | Freq: Four times a day (QID) | INTRAMUSCULAR | Status: DC | PRN

## 2016-10-21 MED ORDER — ASPIRIN EC 81 MG PO TBEC
81.0000 mg | DELAYED_RELEASE_TABLET | Freq: Every day | ORAL | Status: DC
  Administered 2016-10-22 – 2016-10-23 (×2): 81 mg via ORAL
  Filled 2016-10-21 (×2): qty 1

## 2016-10-21 MED ORDER — GI COCKTAIL ~~LOC~~: 30.0000 mL | Freq: Four times a day (QID) | ORAL | Status: DC | PRN

## 2016-10-21 MED ORDER — ENOXAPARIN SODIUM 40 MG/0.4ML ~~LOC~~ SOLN
40.0000 mg | SUBCUTANEOUS | Status: DC
  Administered 2016-10-22: 40 mg via SUBCUTANEOUS
  Filled 2016-10-21: qty 0.4

## 2016-10-21 MED ORDER — METOPROLOL SUCCINATE ER 25 MG PO TB24
75.0000 mg | ORAL_TABLET | Freq: Every day | ORAL | Status: DC
  Administered 2016-10-22 – 2016-10-23 (×2): 75 mg via ORAL
  Filled 2016-10-21 (×2): qty 1

## 2016-10-21 NOTE — ED Triage Notes (Signed)
Last couple of days has had cp constant no n/v/sob

## 2016-10-21 NOTE — H&P (Signed)
Date: 10/21/2016               Patient Name:  Eric Carpenter MRN: [id number]  DOB: Jul 18, 1926 Age / Sex: 83 y.o., male   PCP: Patient, No Pcp Per         Medical Service: Internal Medicine Teaching Service         Attending Physician: Dr. Bartholomew Crews, MD    First Contact: Dr. Maricela Bo Pager: [phone]  Second Contact: Dr. Benjamine Mola Pager: [phone]       After Hours (After 5p/  First Contact Pager: [phone]  weekends / holidays): Second Contact Pager: [phone]   Chief Complaint: Chest pain  History of Present Illness: Eric Carpenter is an 83 yo m with pmh of prostate cancer, htn, aaa, carotid artery occlusion s/p r cea who presented with chest pain. The pain was described as 5/10 intensity, stabbing in nature, constant, lasting 20 min, worsening, and non radiating. The patient does not know of any exacerbating factors or alleviating factors. The pain originally started yesterday night 10/20/16 and was present through the night. He has had similar chest pain previously a few months ago. Pt denies sob, nausea/vomiting, headache, dizziness, or diaphoresis.   ED Course: EKG, Chest x-ray, troponin, aspirin  Meds:  Current Meds  Medication Sig  . ALPRAZolam (XANAX) 0.25 MG tablet Take 0.25 mg by mouth as needed for sleep.   [name] aspirin EC 81 MG tablet Take 81 mg by mouth 2 (two) times a week.  [name] atorvastatin (LIPITOR) 40 MG tablet Take 40 mg by mouth daily.    . hydrochlorothiazide (HYDRODIURIL) 25 MG tablet Take 1 tablet (25 mg total) by mouth daily.  . metoprolol succinate (TOPROL-XL) 50 MG 24 hr tablet Take 1 & 1/2 tablets by mouth daily     Allergies: Allergies as of 10/21/2016 - Review Complete 10/21/2016  Allergen Reaction Noted  . Ciprofloxacin Anxiety and Other (See Comments) 11/11/2010  . Cleocin [clindamycin hcl] Anxiety 03/22/2013  . Tobradex [tobramycin-dexamethasone] Other (See Comments) 09/25/2013  . Neomycin Other (See Comments) 09/25/2013  . Clindamycin Other  (See Comments) and Rash 10/01/2015   Past Medical History:  Diagnosis Date  . Abdominal aortic aneurysm ([place])    a. 03/2013 Abd Duplex: AAA 4.2 x 4.2 cm. b. 02/2014: CTA no dissection; increase in aneurysm to 5.1cm,  . Carotid artery occlusion    a. s/p R CEA 1998;  b. 03/2013 U/S: RICA < 40%, LICA 08-67%.  [name] GERD (gastroesophageal reflux disease)   . H/O cardiovascular stress test    a. Nuc 02/2014:  EF 79%, inferior infarction with preserved EF versus diaphragmatic attenuation given normal inferior wall motion, no evidence of ischemia.  . Hepatic steatosis    a. Hepatic steatosis with probable early cirrhosis on CT 02/2014.  [name] History of measles, mumps, or rubella   . History of TIAs    Questionable CVA in 1989  . Hyperlipidemia   . Hypertension   . Premature atrial contraction   . Prostate cancer ([place])   . PVC's (premature ventricular contractions)   . PVD (peripheral vascular disease) ([place])    a. CT 02/2014: mod stenosis at origins of SMA and L renal artery.  . Status post carotid endarterectomy     Family History:  Father-stroke Mother-Heart failure  Social History:  Former smoker that quit 09/12/9507, no illicit drugs, 1-2 glasses wine per week Lives in nursing home  Review of Systems: A complete ROS was negative except as  per HPI.   Physical Exam: Blood pressure 122/71, pulse (!) 57, temperature 98.5 F (36.9 C), temperature source Oral, resp. rate 16, SpO2 95 %.  Physical Exam  Constitutional: He appears well-developed and well-nourished. No distress.  HENT:  Head: Normocephalic and atraumatic.  Neck: Normal range of motion.  Cardiovascular: Regular rhythm, intact distal pulses and normal pulses.  Bradycardia present.   Pulmonary/Chest: Effort normal and breath sounds normal. No apnea. No respiratory distress.  Lymphadenopathy:    He has no cervical adenopathy.  Skin: Skin is warm.  Psychiatric: He has a normal mood and affect. His speech is normal and  behavior is normal. Thought content normal.    EKG: Sinus Rhythm with 1st degree AV block  CXR: No acute cardiopulmonary disease, hyperinflated lungs, stable mild bibasilar scarring  Assessment & Plan by Problem:  Chest pain Patient has significant risk factors for acute coronary syndrome with his htn, age, sex, family history, pvd, and aaa. Considering patients chest pain has been constant, lasting longer than 10 min, does not have dyspnea or diaphoresis it is likely that his chest pain is not of cardiac origin. Troponin of 0.1 and no st changes on ekg does not indicate acs.  However, his pmh and his previous imaging are supportive of coronary disease.  -His nuclear medicine stress test in 2015 revealed "a medium-sized, severe basal to mid inferoseptal, inferior, and inferolateral perfusion defect with stress and at rest. There was possible infarction with no significant ischemia.  -CT angio chest aorta in 2015 indicated "significant calcified plaque throughout the coronary arteries in a 3 vessel distribution and also likely at the level of the left main coronary artery.   -Chest xray does not indicate any presence of pneumonia or pe  -GI cocktail ordered to rule out any gi etiology  -lovenox 40mg  for dvt prophylaxis  Hypertension Blood pressure during admission has ranged 122-161/70-84 which is being controlled with hctz 25mg  qd, and metoprolol succinate 75mg  qd  Hyperlipidemia Continue with atorvastatin 40mg      Dispo: Admit patient to Observation with expected length of stay less than 2 midnights.  SignedLars Mage, MD 10/21/2016, 6:46 PM  Pager: Pager: [phone]

## 2016-10-21 NOTE — ED Notes (Signed)
Admit Doctor at bedside.  

## 2016-10-21 NOTE — H&P (Signed)
Date: 10/21/2016               Patient Name:  Eric Carpenter MRN: [id number]  DOB: [birth year] Age / Sex: 83 y.o., male   PCP: Patient, No Pcp Per              Medical Service: Internal Medicine Teaching Service              Attending Physician: Dr. Lynnae January, Real Cons, MD    First Contact: Lucendia Herrlich, MS3 Pager: [phone]  Second Contact: Dr. Maricela Bo Pager: [phone]  Third Contact Dr. Benjamine Mola Pager: [phone]       After Hours (After 5p/  First Contact Pager: [phone]  weekends / holidays): Second Contact Pager: [phone]   Chief Complaint: chest pain  History of Present Illness: [name] is an 83yo M with a PMH of AAA, GERD, TIA, prostate cancer, PVD s/p carotid endarterectomy here for evaluation of L sided chest pain. He was seen in the ED with his significant other's daughter at bedside. Patient reports that this is his third episode of chest pain, the first episode occurred earlier this year. He rates the pain 5-6/10 and describes it as "annoying, constant, stabbing" pain that does not radiate. The pain usually lasts 20-30 minutes. He has not noticed any action that brings on the pain and has not tried anything to improve the pain. He says the pain "can't be tied to any pattern."   This episode of chest pain started last night. While the severity of the pain has not changed from his previous episodes, the patient feels that these episodes are occurring more frequently. Patient denies associated symptoms such as nausea, vomiting, headache, diaphoresis, SOB, palpitations. The pain is not reproducible by moving his upper body or arms. Patient reports that he walks a mile a day, 5 days a week and denies associated SOB or chest pain with exertion.   In the ED, he was given aspirin 324 mg, which did not seem to make a difference. CBC, BMP, troponin were drawn. CXR and EKG were performed.  Current Meds: Current Facility-Administered Medications  Medication Dose Route Frequency Provider  Last Rate Last Dose  . acetaminophen (TYLENOL) tablet 650 mg  650 mg Oral Q4H PRN Collier Salina, MD      . Derrill Memo ON 10/22/2016] aspirin EC tablet 81 mg  81 mg Oral Daily Rice, Resa Miner, MD      . atorvastatin (LIPITOR) tablet 40 mg  40 mg Oral Daily Rice, Resa Miner, MD      . enoxaparin (LOVENOX) injection 40 mg  40 mg Subcutaneous Q24H Rice, Resa Miner, MD      . gi cocktail (Maalox,Lidocaine,Donnatal)  30 mL Oral QID PRN Collier Salina, MD      . hydrochlorothiazide (HYDRODIURIL) tablet 25 mg  25 mg Oral Daily Rice, Resa Miner, MD      . metoprolol succinate (TOPROL-XL) 24 hr tablet 75 mg  75 mg Oral Daily Rice, Resa Miner, MD      . ondansetron [facility]) injection 4 mg  4 mg Intravenous Q6H PRN Rice, Resa Miner, MD       Home Meds: Alprazolam (Xanax) 0.25 mg tablet po prn for sleep Aspirin EC 81 mg tablet po 2x/week Atorvastatin (Lipitor) 40 mg tablet po qd Hydrochlorothiazide (Hydrodiuril) 25 mg tablet po qd Metoprolol succinate (Toprol-XL) 75 mg po qd  Allergies: Ciprofloxacin - anxiety Cleocin - anxiety Tobradex Neomycin - rash Clindamycin - rash  Past Medical History:  Abdominal aortic aneurysm  03/2013 Abd Duplex: AAA 4.2 x 4.2 cm.   02/2014: CTA no dissection; increase in aneurysm to 5.1cm Carotid artery occlusion Peripheral vascular disease S/P Carotid endarterectomy HTN Hyperlipidemia GERD TIA Prostate cancer Hepatic steatosis  Past Surgical History: Appendectomy Carotid endarterectomy (R, 1998) Cataract removal ([place], 2012) Eyelid surgery (R, 2013) Hernia repair Prostate surgery Tonsillectomy Total knee arthroplasty (R)  Family History: Mother - HF Father - HTN, hyperlipidemia, stroke  Social History: Patient lives at a home in [place], which he enjoys immensely. His significant other of 40 years suffers from dementia and now also lives in a home. He walks 1 mile 5 days per week. Prior to retirement, pt worked at a  pharmacy filling prescriptions for 20 years. More recently, patient has been thinking about writing a living will and who he would like to be his medical decision maker if he is incapacitated. We discussed additional resources to help him with this process.  High Risk Behaviors: Patient smoked 1 pack of cigarettes a day for 20 years. He quit approximately 21 years ago. He drinks alcohol less than once a month. He denies illicit drug use. He reports eating a balanced diet and walking 5x/week.   Review of Systems: Pertinent items noted in HPI and remainder of comprehensive ROS otherwise negative.  Physical Exam: Blood pressure 122/71, pulse (!) 57, temperature 98.5 F (36.9 C), temperature source Oral, resp. rate 16, SpO2 95 %. BP 122/71   Pulse (!) 57   Temp 98.5 F (36.9 C) (Oral) Comment: Simultaneous filing. User may not have seen previous data. Comment (Src): Simultaneous filing. User may not have seen previous data.  Resp 16   SpO2 95%  General appearance: alert, cooperative and appears stated age Head: Normocephalic, without obvious abnormality, atraumatic Eyes: conjunctivae/corneas clear. PERRL, EOM's intact.  Throat: lips, mucosa, and tongue normal; teeth and gums normal Neck: no adenopathy, no carotid bruit, no JVD, supple, symmetrical, trachea midline and thyroid not enlarged, symmetric, no tenderness/mass/nodules Lungs: clear to auscultation bilaterally Chest wall: no tenderness on palpation Heart: bradycardic, regular rhythm, S1, S2 normal, no murmur, click, rub or gallop Abdomen: soft, non-tender; bowel sounds normal; no masses,  no organomegaly and no abdominal bruit or pulsatile mass Extremities: extremities normal, atraumatic, no cyanosis or edema Skin: Skin color, texture, turgor normal. No rashes or lesions Neurologic: Grossly normal  Lab results: WBC 8.6 Hg/HCT 15.1/46.6 PLT 245 Na 137, K 3.8, Cl 102, CO2 28 BUN 15 Cr 1.10 Troponin 0.01  Imaging results: CXR:  No acute cardiopulmonary disease. Hyperinflated lungs. Stable mild bibasilar scarring. EKG: normal sinus rhythm, 1st degree AV block, unchanged from previous tracing (10/21/15)  Assessment & Plan by Problem: Principal Problem:   Chest pain Active Problems:   Hypertension   Hyperlipidemia   Abdominal aortic aneurysm (HCC)   Coronary artery calcification   PVD (peripheral vascular disease) [facility])  Mr. Rhett is an 83yo M with a PMH of AAA, GERD, TIA, prostate cancer, PVD s/p carotid endarterectomy here for evaluation of L sided chest pain. On exam, he was sitting upright in bed, well-appearing and in no acute distress.   Chest pain Likely cardiac in etiology (ACS) with atypical presentation considering pt hx of HTN, hyperlipidemia, AAA, PVD, coronary artery calcification. However, pt denies chest pain or SOB with exertion. Chest pain is not associated with SOB, diaphoresis, dyspnea. Troponin level in the ED was 0.01, EKG showed no changes from last year, CXR was unremarkable. Nuclear stress test from 2015 showed  possible inferior infarction w/ preserved EF 79% with normal wall motion. Chest pain is not likely d/t a pulmonary cause (pneumonia, pleuritis, PE) as pt denies dyspnea, fever, cough and lungs were clear on auscultation. CBC wnl. CXR showed no infiltrates. Chest pain also not likely d/t GI cause (GERD, esophageal spasm, PID, pancreatitis) as pt denies nausea, vomiting, acid reflux, abdominal pain, difficulty eating. BMP wnl. Pain that is localized and sharp like this could be MSK in etiology, but pt's chest pain could not be reproduced by palpation and does not increase with movement. - Admit and monitor with telemetry overnight - Repeat EKG in the morning - Repeat troponin level in the morning - Have patient walk on the floor tomorrow to evaluate for dyspnea/SOB - Enoxaparin for DVT prophy - Acetaminophen, GI cocktail, ondansetron prn. GI cocktail could r/o GI causes of  pain.  HTN In ED max BP was 159/87. Last BP was 122/71. Continue home metoprolol and hydrochlorothiazide.  Hyperlipidemia Manage on home statin.  Abdominal aortic aneurysm  Coronary artery calcification PVD (peripheral vascular disease) Stable. Manage on home medications.   FEN: heart healthy diet, NPO at midnight  This is a Careers information officer Note.  The care of the patient was discussed with Dr. Lynnae January and the assessment and plan was formulated with their assistance.  Please see their note for official documentation of the patient encounter.   Signed: Murlean Caller, Medical Student 10/21/2016, 6:25 PM  Pager: [phone]

## 2016-10-21 NOTE — ED Provider Notes (Signed)
[facility] DEPT Provider Note   CSN: [id number] Arrival date & time: 10/21/16  1021     History   Chief Complaint Chief Complaint  Patient presents with  . Chest Pain    HPI NAHSIR Eric Carpenter is a 83 y.o. male.  HPI   83 year old male with history of AAA, GERD, TIA, prostate cancer peripheral vascular disease presenting for evaluation of chest pain.Patient report for the past several months he has had intermittent recurrent midsternal chest discomfort. He described the discomfort as a sharp sensation, usually lasting for several hours, with associate dizziness sensation and resolved without any specific treatment. Symptoms seems to be recurring more frequent. He has had several episodes last night but has been pain-free   for the past 5 hours. States pain occasionally radiates to his right shoulder. He denies any associated fever, chills, neck pain, exertional chest pain, shortness of breath, productive cough, abdominal pain or back pain. He does have history of GERD but this felt different. He report no recent productive cardiac testing. He is a former smoker having smoked in many years. He denies any prior MI. He does have a cardiologist. He also mentioned that his doctor did say he may eventually need operation for his AAA. His Cardiologist is Dr. Martinique.     Past Medical History:  Diagnosis Date  . Abdominal aortic aneurysm ([place])    a. 03/2013 Abd Duplex: AAA 4.2 x 4.2 cm. b. 02/2014: CTA no dissection; increase in aneurysm to 5.1cm,  . Carotid artery occlusion    a. s/p R CEA 1998;  b. 03/2013 U/S: RICA < 24%, LICA 40-10%.  [name] GERD (gastroesophageal reflux disease)   . H/O cardiovascular stress test    a. Nuc 02/2014:  EF 79%, inferior infarction with preserved EF versus diaphragmatic attenuation given normal inferior wall motion, no evidence of ischemia.  . Hepatic steatosis    a. Hepatic steatosis with probable early cirrhosis on CT 02/2014.  [name] History of measles, mumps,  or rubella   . History of TIAs    Questionable CVA in 1989  . Hyperlipidemia   . Hypertension   . Premature atrial contraction   . Prostate cancer ([place])   . PVC's (premature ventricular contractions)   . PVD (peripheral vascular disease) ([place])    a. CT 02/2014: mod stenosis at origins of SMA and L renal artery.  . Status post carotid endarterectomy     Patient Active Problem List   Diagnosis Date Noted  . Hepatic steatosis 03/05/2014  . PVD (peripheral vascular disease) ([place]) 03/05/2014  . Coronary artery calcification 03/04/2014  . Midsternal chest pain 03/04/2014  . Occlusion and stenosis of carotid artery without mention of cerebral infarction 03/23/2012  . Abdominal aneurysm without mention of rupture 03/23/2012  . Hypertension   . Hyperlipidemia   . PVC's (premature ventricular contractions)   . Premature atrial contraction   . Abdominal aortic aneurysm ([place])   . Status post carotid endarterectomy     Past Surgical History:  Procedure Laterality Date  . APPENDECTOMY    . CAROTID ENDARTERECTOMY Right 1998   cea  . EYE SURGERY  July 2012   Cataract Bilateral eye  . EYE SURGERY  Dec. 18, 2013   Right lower eyelid surgery  . HERNIA REPAIR    . PROSTATE SURGERY    . TONSILLECTOMY    . TOTAL KNEE ARTHROPLASTY     right       Home Medications    Prior to Admission medications  Medication Sig Start Date End Date Taking? Authorizing Provider  ALPRAZolam (XANAX) 0.25 MG tablet Take 0.25 mg by mouth as needed.      [provider]  atorvastatin (LIPITOR) 40 MG tablet Take 40 mg by mouth daily.      [provider]  hydrochlorothiazide (HYDRODIURIL) 25 MG tablet Take 1 tablet (25 mg total) by mouth daily. 04/26/14   Martinique, Peter M, MD  metoprolol succinate (TOPROL-XL) 50 MG 24 hr tablet Take 1 & 1/2 tablets by mouth daily 01/15/16   Martinique, Peter M, MD    Family History Family History  Problem Relation Age of Onset  . Stroke Father   . Heart  failure Mother     Social History Social History  Substance Use Topics  . Smoking status: Former Smoker    Quit date: 04/14/1979  . Smokeless tobacco: Never Used  . Alcohol use 0.6 - 1.2 oz/week    1 - 2 Glasses of wine per week     Allergies   Ciprofloxacin; Cleocin [clindamycin hcl]; Tobradex [tobramycin-dexamethasone]; Neomycin; and Clindamycin   Review of Systems Review of Systems  All other systems reviewed and are negative.    Physical Exam Updated Vital Signs BP 127/70 (BP Location: Left Arm) Comment: Simultaneous filing. User may not have seen previous data. Comment (BP Location): Simultaneous filing. User may not have seen previous data.  Pulse 65 Comment: Simultaneous filing. User may not have seen previous data.  Temp 98.5 F (36.9 C) (Oral) Comment: Simultaneous filing. User may not have seen previous data. Comment (Src): Simultaneous filing. User may not have seen previous data.  Resp 16 Comment: Simultaneous filing. User may not have seen previous data.  SpO2 92% Comment: Simultaneous filing. User may not have seen previous data.  Physical Exam  Constitutional: He is oriented to person, place, and time. He appears well-developed and well-nourished. No distress.  HENT:  Head: Atraumatic.  Eyes: Conjunctivae are normal.  Neck: Normal range of motion. Neck supple. No JVD present. No tracheal deviation present.  Cardiovascular: Normal rate, regular rhythm and intact distal pulses.   Pulmonary/Chest: Effort normal and breath sounds normal. No stridor.  Abdominal: Soft. Bowel sounds are normal. He exhibits no distension. There is no tenderness.  No abdominal bruit, no pulsatile mass  Musculoskeletal: He exhibits no edema.  Neurological: He is alert and oriented to person, place, and time.  Skin: No rash noted.  Psychiatric: He has a normal mood and affect.  Nursing note and vitals reviewed.    ED Treatments / Results  Labs (all labs ordered are listed, but  only abnormal results are displayed) Labs Reviewed  BASIC METABOLIC PANEL - Abnormal; Notable for the following:       Result Value   GFR calc non Af Amer 58 (*)    All other components within normal limits  CBC  I-STAT TROPOININ, ED    EKG  EKG Interpretation  Date/Time:  Wednesday October 21 2016 10:26:27 EDT Ventricular Rate:  68 PR Interval:  234 QRS Duration: 84 QT Interval:  414 QTC Calculation: 440 R Axis:   -22 Text Interpretation:  Sinus rhythm with 1st degree A-V block Otherwise normal ECG No significant change since last tracing Confirmed by [name] [phone]) on 10/21/2016 1:29:31 PM     ED ECG REPORT   Date: 10/21/2016  Rate: 68  Rhythm: normal sinus rhythm  QRS Axis: normal  Intervals: PR prolonged  ST/T Wave abnormalities: normal  Conduction Disutrbances:first-degree A-V block  Narrative Interpretation:   Old EKG Reviewed: unchanged  I have personally reviewed the EKG tracing and agree with the computerized printout as noted.   Radiology Dg Chest 2 View  Result Date: 10/21/2016 CLINICAL DATA:  Chest pain EXAM: CHEST  2 VIEW COMPARISON:  04/23/2016 chest radiograph. FINDINGS: Surgical clips are noted in the medial right lower neck. Stable cardiomediastinal silhouette with normal heart size and aortic atherosclerosis. No pneumothorax. No pleural effusion. Hyperinflated lungs. No pulmonary edema. No acute consolidative airspace disease. Mild bibasilar scarring is stable. IMPRESSION: 1. No acute cardiopulmonary disease. 2. Hyperinflated lungs, suggesting COPD. 3. Stable mild bibasilar scarring. Electronically Signed   By: Ilona Sorrel M.D.   On: 10/21/2016 10:49    Procedures Procedures (including critical care time)  Medications Ordered in ED Medications  aspirin chewable tablet 324 mg (324 mg Oral Given 10/21/16 1343)     Initial Impression / Assessment and Plan / ED Course  I have reviewed the triage vital signs and the nursing notes.  Pertinent  labs & imaging results that were available during my care of the patient were reviewed by me and considered in my medical decision making (see chart for details).     BP (!) 161/84   Pulse 62   Temp 98.5 F (36.9 C) (Oral) Comment: Simultaneous filing. User may not have seen previous data. Comment (Src): Simultaneous filing. User may not have seen previous data.  Resp 12   SpO2 96%    Final Clinical Impressions(s) / ED Diagnoses   Final diagnoses:  Atypical chest pain    New Prescriptions New Prescriptions   No medications on file   11:12 AM Patient with history of AAA, and history of GERD who is here for recurrent midsternal chest pain. No active chest pain for the past 5 hours. Is well-appearing with stable normal vital sign. Patient likely will need a cardiac rule out. May also assess his AAA with a chest abdomen pelvis CT.  Care discussed with DR. Winfred Leeds.  1:31 PM Pt currently pain free.  ASA given.  He has hx of infrarenal AAA, however no abd pain therefore pain not likely due to dissection.  He will benefit from admission for CP rule out.  HEART score of 4.  Moderate risk for MACE.  2:19 PM Appreciate consultation from [facility] resident who agrees to see pt in the ER and will admit for chest pain rule out. Pt currently stable.       Domenic Moras, PA-C 10/21/16 [place], MD 10/21/16 1623

## 2016-10-21 NOTE — ED Provider Notes (Addendum)
Complains of anterior chest pain intermittently over the past 3 weeks. He had chest pain last night lasting from 6:30 PM till he went to bed at 11 PM and awakened at 7 AM this morning with anterior chest pain he describes the pain as pressure. Nothing makes symptoms better or worse. No other associated symptoms. He is presently asymptomatic without treatment.   [name], MD 10/21/16 1330 On exam no distress HEENT exam no facial asymmetry neck supple no bruit heart regular rate and rhythm no murmurs abdomen nondistended no nontender. Extremities without edema Chest x-ray viewed by me   [name], MD 10/21/16 1331

## 2016-10-21 NOTE — ED Notes (Signed)
Admitting Provider at bedside. 

## 2016-10-21 NOTE — Progress Notes (Signed)
Received to [id number] via stretcher from ED. Denies SOB, CP or other c/o at this time. Oriented to room and unit. Call bell in reach. Encouraged to call for needs. Verbalized understanding.

## 2016-10-22 ENCOUNTER — Observation Stay (HOSPITAL_BASED_OUTPATIENT_CLINIC_OR_DEPARTMENT_OTHER): Payer: Medicare Other

## 2016-10-22 ENCOUNTER — Encounter (HOSPITAL_COMMUNITY): Payer: Self-pay | Admitting: General Practice

## 2016-10-22 ENCOUNTER — Observation Stay (HOSPITAL_COMMUNITY): Payer: Medicare Other

## 2016-10-22 DIAGNOSIS — R0789 Other chest pain: Secondary | ICD-10-CM

## 2016-10-22 DIAGNOSIS — I714 Abdominal aortic aneurysm, without rupture: Secondary | ICD-10-CM

## 2016-10-22 DIAGNOSIS — I10 Essential (primary) hypertension: Secondary | ICD-10-CM

## 2016-10-22 DIAGNOSIS — R079 Chest pain, unspecified: Secondary | ICD-10-CM | POA: Diagnosis not present

## 2016-10-22 DIAGNOSIS — E7800 Pure hypercholesterolemia, unspecified: Secondary | ICD-10-CM

## 2016-10-22 DIAGNOSIS — Z8673 Personal history of transient ischemic attack (TIA), and cerebral infarction without residual deficits: Secondary | ICD-10-CM | POA: Diagnosis not present

## 2016-10-22 DIAGNOSIS — I739 Peripheral vascular disease, unspecified: Secondary | ICD-10-CM | POA: Diagnosis not present

## 2016-10-22 DIAGNOSIS — Z79899 Other long term (current) drug therapy: Secondary | ICD-10-CM | POA: Diagnosis not present

## 2016-10-22 LAB — NM MYOCAR MULTI W/SPECT W/WALL MOTION / EF
CHL CUP RESTING HR STRESS: 60 {beats}/min
CSEPHR: 64 %
MPHR: 133 {beats}/min
Peak HR: 86 {beats}/min

## 2016-10-22 LAB — TROPONIN I: Troponin I: <0.03 ng/mL (ref <0.03)

## 2016-10-22 MED ORDER — REGADENOSON 0.4 MG/5ML IV SOLN
0.4000 mg | Freq: Once | INTRAVENOUS | Status: AC
  Administered 2016-10-22: 0.4 mg via INTRAVENOUS
  Filled 2016-10-22: qty 5

## 2016-10-22 MED ORDER — TECHNETIUM TC 99M TETROFOSMIN IV KIT
30.0000 | PACK | Freq: Once | INTRAVENOUS | Status: AC | PRN
  Administered 2016-10-22: 30 via INTRAVENOUS

## 2016-10-22 MED ORDER — TECHNETIUM TC 99M TETROFOSMIN IV KIT
10.0000 | PACK | Freq: Once | INTRAVENOUS | Status: AC
  Administered 2016-10-22: 10 via INTRAVENOUS

## 2016-10-22 MED ORDER — REGADENOSON 0.4 MG/5ML IV SOLN
INTRAVENOUS | Status: AC
  Filled 2016-10-22: qty 5

## 2016-10-22 MED ORDER — SODIUM CHLORIDE 0.9 % IV SOLN
INTRAVENOUS | Status: DC
  Administered 2016-10-22 – 2016-10-23 (×2): via INTRAVENOUS

## 2016-10-22 NOTE — Progress Notes (Signed)
   Eric Carpenter presented for a nuclear stress test today.  No immediate complications.  Stress imaging is pending at this time.  Preliminary EKG findings may be listed in the chart, but the stress test result will not be finalized until perfusion imaging is complete.  Tami Lin Saide Lanuza, PA-C 10/22/2016, 12:09 PM

## 2016-10-22 NOTE — Progress Notes (Signed)
  Date: 10/22/2016  Patient name: FLOYED MASOUD  Medical record number: [id number]  Date of birth: 28-Oct-1926   I have seen and evaluated Jolaine Click and discussed their care with the Residency Team. Mr Schoene is an 83 year old with known vascular disease who presented with CP which was atypical as it was stabbing in nature and was nonexertional. He also did not have any associated factors like dyspnea or diaphoresis.  He is able to walk a mile daily without any chest pain or dyspnea.   He has a known AAA managed by Dr. early and last assessed by CT scan in November 2015. At that time, the infrarenal AAA had a maximum diameter of 5.1 cm. Dr. early last saw the patient in April of this year and was going to repeat the CT scan about October. She had an ultrasound today which showed maximum diameter of 5.6 cm.   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 When he wakes every morning, he exercises for 30 minutes.  Vitals:   10/22/16 1201 10/22/16 1204  BP: 124/90 131/71  Pulse:    Resp: 20 20  Temp:    T max 98.8 HR 60 Gen Sitting SOB, eating, NAD  Cr 1.10 Trop - x3 HgB 15.1  I personally viewed his CXR images and confirmed by reading with the official read. Two-view, overpenetrated, slight blunting of left angle. Hyperinflation, aortic calcification.  I personally viewed his EKG and confirmed by reading with the official read. Sinus, left axis deviation, first-degree AV block, no ischemic changes.  Assessment and Plan: I have seen and evaluated the patient as outlined above. I agree with the formulated Assessment and Plan as detailed in the residents' note, with the following changes: Mr. Crean is a 83 year old man with known peripheral arterial disease, AAA, and carotid artery disease. He came in with atypical chest pain and ruled out for AMI with troponins and EKGs. He has since had a stress test which is pending. Of note, is abdominal  ultrasound showed an enlarged AAA.  1. Atypical chest pain - he has been continued on his home medications of aspirin, beta blocker, and high intensity statin for secondary prevention. We will await his final stress test results.   2. AAA - diameter is now 5.6 cm. We will discuss results with Dr. Donnetta Hutching and follow his recommendations.   Dispo - will depend on stress test results and Dr. Luther Parody recommendation.    Bartholomew Crews, MD 7/12/20183:01 PM

## 2016-10-22 NOTE — Care Management Obs Status (Signed)
[facility] NOTIFICATION   Patient Details  Name: EMIGDIO WILDEMAN MRN: [id number] Date of Birth: [birth year]   Medicare Observation Status Notification Given:  Yes    Bethena Roys, RN 10/22/2016, 4:09 PM

## 2016-10-22 NOTE — Consult Note (Signed)
CONSULT NOTE   MRN : [id number]  Reason for Consult: AAA Referring Physician: Dr. Lynnae January  History of Present Illness: 83 y/o male known to our practice.  He last saw Dr. Donnetta Hutching on 08/08/2016.  Duplex of his aorta shows a continued slow increase in size now up to 5.3 cm with a maximal diameter in June 2017 predicted at 4.8 cm.  Dr. Donnetta Hutching had long discussion regarding this and I explained that he is really at the threshold where we would consider elective repair. I looked back at his CT scan from November 2015. At that time it appeared that he would've been a stent stent graft candidate. I have recommended that we see him again in 6 months with CT scan at that time. He is comfortable with this recommendation will see [place] again.    He presented to the ED with CP.  He also did not have any associated factors like dyspnea or diaphoresis.  Today's ultrasound shows 5.6 cm AAA.  We have been consulted to review his AAA.  He has no complaints of abdominal pain or new lumbar pain at this time.  He describes a right hip that radiates to his scapula last for about 30 seconds and then goes away.  He states this has been happening for many years.         Current Facility-Administered Medications  Medication Dose Route Frequency Provider Last Rate Last Dose  . acetaminophen (TYLENOL) tablet 650 mg  650 mg Oral Q4H PRN Collier Salina, MD      . aspirin EC tablet 81 mg  81 mg Oral Daily Collier Salina, MD   81 mg at 10/22/16 1003  . atorvastatin (LIPITOR) tablet 40 mg  40 mg Oral Daily Collier Salina, MD   40 mg at 10/22/16 1002  . enoxaparin (LOVENOX) injection 40 mg  40 mg Subcutaneous Q24H Rice, Resa Miner, MD      . gi cocktail (Maalox,Lidocaine,Donnatal)  30 mL Oral QID PRN Collier Salina, MD      . hydrochlorothiazide (HYDRODIURIL) tablet 25 mg  25 mg Oral Daily Collier Salina, MD   25 mg at 10/22/16 1002  . metoprolol succinate (TOPROL-XL) 24 hr tablet 75 mg  75 mg Oral  Daily Collier Salina, MD   75 mg at 10/22/16 1003  . ondansetron (ZOFRAN) injection 4 mg  4 mg Intravenous Q6H PRN Rice, Resa Miner, MD      . regadenoson (LEXISCAN) 0.4 MG/5ML injection SOLN             Pt meds include: Statin :Yes Betablocker: Yes ASA: Yes Other anticoagulants/antiplatelets: none  Past Medical History:  Diagnosis Date  . AAA (abdominal aortic aneurysm) ([place])   . Abdominal aortic aneurysm ([place])    a. 03/2013 Abd Duplex: AAA 4.2 x 4.2 cm. b. 02/2014: CTA no dissection; increase in aneurysm to 5.1cm,  . Carotid artery occlusion    a. s/p R CEA 1998;  b. 03/2013 U/S: RICA < 08%, LICA 14-48%.  . Chest pain 10/21/2016  . GERD (gastroesophageal reflux disease)   . H/O cardiovascular stress test    a. Nuc 02/2014:  EF 79%, inferior infarction with preserved EF versus diaphragmatic attenuation given normal inferior wall motion, no evidence of ischemia.  . Hepatic steatosis    a. Hepatic steatosis with probable early cirrhosis on CT 02/2014.  [name] History of measles, mumps, or rubella   . History of TIAs    Questionable CVA in  1989  . Hyperlipidemia   . Hypertension   . Premature atrial contraction   . Prostate cancer ([place])   . PVC's (premature ventricular contractions)   . PVD (peripheral vascular disease) ([place])    a. CT 02/2014: mod stenosis at origins of SMA and L renal artery.  . Status post carotid endarterectomy     Past Surgical History:  Procedure Laterality Date  . APPENDECTOMY    . CAROTID ENDARTERECTOMY Right 1998   cea  . EYE SURGERY  July 2012   Cataract Bilateral eye  . EYE SURGERY  Dec. 18, 2013   Right lower eyelid surgery  . HERNIA REPAIR    . PROSTATE SURGERY    . TONSILLECTOMY    . TOTAL KNEE ARTHROPLASTY     right    Social History Social History  Substance Use Topics  . Smoking status: Former Smoker    Packs/day: 1.00    Years: 20.00    Types: Cigarettes    Quit date: 04/14/1979  . Smokeless tobacco: Never Used  . Alcohol  use 0.6 oz/week    1 Standard drinks or equivalent per week    Family History Family History  Problem Relation Age of Onset  . Stroke Father   . Hypertension Father   . Hyperlipidemia Father   . Heart failure Mother     Allergies  Allergen Reactions  . Ciprofloxacin Anxiety and Other (See Comments)    Per patient "bad sense of well being"; can take if absolutely necessary   . Cleocin [Clindamycin Hcl] Anxiety  . Tobradex [Tobramycin-Dexamethasone] Other (See Comments)    Opthalmic-burning Opthalmic-burning  . Neomycin Other (See Comments)    other  . Clindamycin Other (See Comments) and Rash     REVIEW OF SYSTEMS  General: [ ]  Weight loss, [ ]  Fever, [ ]  chills Neurologic: [ ]  Dizziness, [ ]  Blackouts, [ ]  Seizure [ ]  Stroke, [ ]  "Mini stroke", [ ]  Slurred speech, [ ]  Temporary blindness; [ ]  weakness in arms or legs, [ ]  Hoarseness [ ]  Dysphagia Cardiac: [ ]  Chest pain/pressure, [ ]  Shortness of breath at rest [ ]  Shortness of breath with exertion, [ ]  Atrial fibrillation or irregular heartbeat  Vascular: [ ]  Pain in legs with walking, [ ]  Pain in legs at rest, [ ]  Pain in legs at night,  [ ]  Non-healing ulcer, [ ]  Blood clot in vein/DVT,   Pulmonary: [ ]  Home oxygen, [ ]  Productive cough, [ ]  Coughing up blood, [ ]  Asthma,  [ ]  Wheezing [ ]  COPD Musculoskeletal:  [x ] Arthritis, [ ]  Low back pain, [ ]  Joint pain Hematologic: [ ]  Easy Bruising, [ ]  Anemia; [ ]  Hepatitis Gastrointestinal: [ ]  Blood in stool, [ ]  Gastroesophageal Reflux/heartburn, Urinary: [ ]  chronic Kidney disease, [ ]  on HD - [ ]  MWF or [ ]  TTHS, [ ]  Burning with urination, [ ]  Difficulty urinating Skin: [ ]  Rashes, [ ]  Wounds Psychological: [ ]  Anxiety, [ ]  Depression  Physical Examination Vitals:   10/22/16 1157 10/22/16 1201 10/22/16 1204 10/22/16 1500  BP: 135/82 124/90 131/71   Pulse:      Resp: 18 20 20    Temp:      TempSrc:      SpO2:      Weight:      Height:    5\' 9"  (1.753 m)   Body  mass index is 23.14 kg/m.  General:  WDWN in NAD Gait: Normal HENT: WNL  Eyes: Pupils equal Pulmonary: normal non-labored breathing , without Rales, rhonchi,  wheezing Cardiac: RRR, without  Murmurs, rubs or gallops; No carotid bruits Abdomen: soft, NT, no masses Skin: no rashes, ulcers noted;  no Gangrene , no cellulitis; no open wounds;   Vascular Exam/Pulses:palpable radial, femoral and DP 2+ pulses   Musculoskeletal: no muscle wasting or atrophy; no edema  Neurologic: A&O X 3; Appropriate Affect ;  SENSATION: normal; MOTOR FUNCTION: 5/5 Symmetric Speech is fluent/normal   Significant Diagnostic Studies: CBC Lab Results  Component Value Date   WBC 8.6 10/21/2016   HGB 15.1 10/21/2016   HCT 46.6 10/21/2016   MCV 90.8 10/21/2016   PLT 245 10/21/2016    BMET    Component Value Date/Time   NA 137 10/21/2016 1035   K 3.8 10/21/2016 1035   CL 102 10/21/2016 1035   CO2 28 10/21/2016 1035   GLUCOSE 98 10/21/2016 1035   BUN 15 10/21/2016 1035   CREATININE 1.10 10/21/2016 1035   CREATININE 0.95 05/09/2014 1614   CALCIUM 9.1 10/21/2016 1035   GFRNONAA 58 (L) 10/21/2016 1035   GFRAA >60 10/21/2016 1035   Estimated Creatinine Clearance: 47.3 mL/min (by C-G formula based on SCr of 1.1 mg/dL).  COAG Lab Results  Component Value Date   INR 1.96 (H) 07/13/2009   INR 1.20 07/12/2009   INR 1.23 07/11/2009     Non-Invasive Vascular Imaging:   ultrasound today which showed maximum diameter of 5.6 cm.   ASSESSMENT/PLAN:  AAA asymptomatic We will order a CTA for a better view of the AAA and more precise measurements.      Laurence Slate [facility] 10/22/2016 3:52 PM   Addendum  I have independently interviewed and examined the patient, and I agree with the physician assistant's findings.  I doubt this pt has a sx AAA.  CTA abd/pelvis will give a better evaluation of anatomy for EVAR planning purposes.  Suspect pt will NOT need to be repaired this admission, but given  the patient meets repair criteria suspect Dr. Donnetta Hutching will plan on repair in the next 2-4 weeks.  - Dr. Donnetta Hutching to see in the AM.   Adele Barthel, MD, [facility] [place] Office: [phone] Pager: [phone]  10/22/2016, 6:32 PM

## 2016-10-22 NOTE — Progress Notes (Signed)
Subjective: Eric Carpenter is an 83yo M with a PMH of AAA, GERD, TIA, prostate cancer, PVD s/p carotid endarterectomy here for evaluation of L sided chest pain. Today he endorses feeling well. He reports completing his procedures (aortic US and stress test) this morning without issues. He denies difficulty ambulating, difficulty eating, SOB, HA, chest pain. He reports new L sided pain, which he describes as a "rod or stick" that spans from hip to flank. The pain lasts "milliseconds" and is very similar to his chronic R sided pain. He voices no other concerns.  Objective: Vital signs in last 24 hours: Vitals:   10/22/16 1157 10/22/16 1201 10/22/16 1204 10/22/16 1500  BP: 135/82 124/90 131/71   Pulse:      Resp: 18 20 20    Temp:      TempSrc:      SpO2:      Weight:      Height:    5\' 9"  (1.753 m)    Intake/Output Summary (Last 24 hours) at 10/22/16 1610 Last data filed at 10/22/16 1400  Gross per 24 hour  Intake              600 ml  Output             1150 ml  Net             -550 ml   BP 131/71   Pulse 60   Temp 97.8 F (36.6 C) (Oral)   Resp 20   Ht 5\' 9"  (1.753 m)   Wt 71.1 kg (156 lb 11.2 oz)   SpO2 95%   BMI 23.14 kg/m  General appearance: alert, cooperative and appears stated age Lungs: clear to auscultation bilaterally Chest wall: no tenderness Heart: regular rate and rhythm, S1, S2 normal, no murmur, click, rub or gallop Abdomen: soft, non-tender; bowel sounds normal; no masses,  no organomegaly Extremities: extremities normal, atraumatic, no cyanosis or edema  Lab Results: Troponin @ 2951 today: <0.03  Studies/Results: Dg Chest 2 View  Result Date: 10/21/2016 CLINICAL DATA:  Chest pain EXAM: CHEST  2 VIEW COMPARISON:  04/23/2016 chest radiograph. FINDINGS: Surgical clips are noted in the medial right lower neck. Stable cardiomediastinal silhouette with normal heart size and aortic atherosclerosis. No pneumothorax. No pleural effusion. Hyperinflated lungs. No  pulmonary edema. No acute consolidative airspace disease. Mild bibasilar scarring is stable. IMPRESSION: 1. No acute cardiopulmonary disease. 2. Hyperinflated lungs, suggesting COPD. 3. Stable mild bibasilar scarring. Electronically Signed   By: Ilona Sorrel M.D.   On: 10/21/2016 10:49   US Aorta  Result Date: 10/22/2016 CLINICAL DATA:  AAA EXAM: ULTRASOUND OF ABDOMINAL AORTA TECHNIQUE: Ultrasound examination of the abdominal aorta was performed to evaluate for abdominal aortic aneurysm. COMPARISON:  CT 03/04/2014 FINDINGS: Abdominal aortic measurements as follows: Proximal:  2.9  cm Mid:  Obscured by gas Distal:  5.6  cm IMPRESSION: Infrarenal abdominal aortic aneurysm measuring up to 5.6 cm with moderate mural plaque. This is increased in size when compared to prior CT when this measured maximally 5.1 cm. Vascular surgery consultation recommended due to increased risk of rupture for AAA >5.5 cm. This recommendation follows ACR consensus guidelines: White Paper of the ACR Incidental Findings Committee II on Vascular Findings. J Am Coll Radiol 2013; 10:789-794. Electronically Signed   By: Rolm Baptise M.D.   On: 10/22/2016 11:26   EKG 12-Lead Result Date: 10/22/2016 Sinus rhythm with 1st degree AV block, L axis deviation  Nuclear stress test Result Date:  10/22/2016 No ST segment deviation noted during stress. Rare PVCs seen but no significant change from baseline.  Medications: I have reviewed the patient's current medications. Scheduled Meds: . aspirin EC  81 mg Oral Daily  . atorvastatin  40 mg Oral Daily  . enoxaparin (LOVENOX) injection  40 mg Subcutaneous Q24H  . hydrochlorothiazide  25 mg Oral Daily  . metoprolol succinate  75 mg Oral Daily  . regadenoson       Continuous Infusions: PRN Meds:.acetaminophen, gi cocktail, ondansetron (ZOFRAN) IV Assessment/Plan: Principal Problem:   Chest pain Active Problems:   Hypertension   Hyperlipidemia   Abdominal aortic aneurysm (H[facility])    Coronary artery calcification   PVD (peripheral vascular disease) (H[facility])   Atypical chest pain  Eric Carpenter is an 83yo M with a PMH of AAA, GERD, TIA, prostate cancer, PVD s/p carotid endarterectomy here for evaluation of L sided chest pain. On exam, he was sitting upright in bed and finishing his lunch. He was pleasant, well-appearing and in no acute distress.   Chest pain Likely cardiac in etiology (ACS) with atypical presentation considering pt hx of HTN, hyperlipidemia, AAA, PVD, coronary artery calcification.  - Troponin x3 <0.03 - Repeat EKG was unchanged - Stress test unremarkable - Enoxaparin for DVT prophy - Acetaminophen, GI cocktail, ondansetron prn - Discharge either this evening or tomorrow depending on Cardiology consult  HTN BP stable. Continue home metoprolol and hydrochlorothiazide.  Hyperlipidemia Manage on home statin.  Abdominal aortic aneurysm  - Aortic US showed AAA increased from 5.1cm (03/04/2014) to 5.6cm - Consult Cardiology and pt's vascular surgeon, Dr. Sherren Mocha Early - Continue home medications  Coronary artery calcification PVD (peripheral vascular disease) Stable. Manage on home medications.   FEN: heart healthy diet  Dispo: Expected discharge to home this evening or tomorrow morning pending Cardiology and Dr. Luther Parody recommendations.  This is a Careers information officer Note.  The care of the patient was discussed with Dr. Lynnae January and the assessment and plan formulated with their assistance.  Please see their attached note for official documentation of the daily encounter.   LOS: 0 days   Murlean Caller, Medical Student 10/22/2016, 4:10 PM  Pager: [phone]

## 2016-10-22 NOTE — Progress Notes (Signed)
   Subjective: [name] was seen sitting in his bed eating lunch this afternoon. He said he was doing well. He denied any chest pain or sob. He states he has some dizziness and lightheadedness, but he has had this for awhile now. He does state that he has some pain on his left side from mid thigh to his left flank area. He describes the pain as a rod or stick in him and states that it is sharp, lasts milliseconds, and has occurred twice this year. He states that he has this type of pain on his right side for awhile now.  Objective:  Vital signs in last 24 hours: Vitals:   10/22/16 1157 10/22/16 1201 10/22/16 1204 10/22/16 1500  BP: 135/82 124/90 131/71   Pulse:      Resp: 18 20 20    Temp:      TempSrc:      SpO2:      Weight:      Height:    5\' 9"  (1.753 m)   Physical Exam  Constitutional: He is oriented to person, place, and time and well-developed, well-nourished, and in no distress. No distress.  HENT:  Head: Normocephalic and atraumatic.  Cardiovascular: Normal rate, regular rhythm, normal heart sounds and intact distal pulses.   Pulmonary/Chest: Effort normal and breath sounds normal. No respiratory distress.  Abdominal: Soft. Bowel sounds are normal. He exhibits no distension.  Neurological: He is alert and oriented to person, place, and time.  Psychiatric: Memory, affect and judgment normal.    Assessment/Plan: Atypical chest pain The patient's pain is likely to be cardiac in etiology due to his risk factors of htn, age, sex, pvd, AAA, coronary artery calcification, and hyperlipidemia. His previous nuclear medicine stress test shows some areas of infarction. His previous CT angio shows calcification in his his coronary arteries.  -Troponin negative x3 -EKG on admission showed sinus rhythm with lad and 1st degree av block. Repeat ekg did not show any changes.  -Aspirin 81, atorvastatin, and metoprolol 75mg  given for prevention of MI -NM myocardial stress test showed not  ST segment deviation, ef of 72%, large defect in the inferior location likely to indicate an inferior infarct. Diaphragmatic attenuation without ischemia is present per cardiology evaluation.  Hypertension -BP over the past 24 hrs 94-161/63-84 -Managed with hctz 25mg  qd, metoprolol 75mg  qd  Hyperlipidemia Continue on home atorvastatin 40mg   Abdominal aortic aneurysm (HCC) -US of the abdomen showed infrarenal abdominal aortic aneurysm measuring 5.6cm which has increased from his previous 5.1cm.  -CT of abdomen -Vascular surgery consulted for operative evaluation -Left message for pt's vascular surgeon Dr. Sherren Mocha Early to inform of test results and discuss next steps.    PVD (peripheral vascular disease) ([place]) -Ordered ABI to assess   Dispo: Anticipated discharge in approximately 1 day   Lars Mage, MD Internal Medicine PGY1 Pager:[phone] 10/22/2016, 5:02 PM

## 2016-10-22 NOTE — Consult Note (Signed)
Cardiology Consultation:   Patient ID: KUE FOX; [id number]; 23-Sep-1926   Admit date: 10/21/2016 Date of Consult: 10/22/2016  Primary Care Provider: Dr Jerold Coombe Primary Cardiologist: Dr Martinique 10/2015 Primary Electrophysiologist:  N/a   Patient Profile:   Eric Carpenter is a 83 y.o. male with a hx of AAA 4.5 x 4.7  Cm (followed by Dr Martinique and Dr Donnetta Hutching), nl EF w/ inf attn on MV 0938, LICA 18%, s/p RCEA, TIA, and HTN, who is being seen today for the evaluation of CP at the request of Dr Lynnae January.  History of Present Illness:   Eric Carpenter was in his USOH yesterday. He does his own housework and other things without CP or SOB. He was told to stop running, but walks daily.   He developed sharp CP, did not start with exertion. Sharp, 8-9/10. He stopped what he was doing. It was gone in a second or 2, happened 2 times in a row. He was concerned about the pain and came to the ER. He was not SOB, Nauseated or diaphoretic with the pain. The pain went away before he could notice any change with position or deep inspiration.  Since being in the hospital, he has had no more CP. He had an episode last pm of pain that went from his R hip to his R upper arm. Never had this before, similar to CP in quality and intensity, but in a different location.   Pt has no hx exertional symptoms with his daily exercise. Denies DOE, LE edema, orthopnea, PND or palpitations.   Past Medical History:  Diagnosis Date  . Abdominal aortic aneurysm ([place])    a. 03/2013 Abd Duplex: AAA 4.2 x 4.2 cm. b. 02/2014: CTA no dissection; increase in aneurysm to 5.1cm,  . Carotid artery occlusion    a. s/p R CEA 1998;  b. 03/2013 U/S: RICA < 29%, LICA 93-71%.  [name] GERD (gastroesophageal reflux disease)   . H/O cardiovascular stress test    a. Nuc 02/2014:  EF 79%, inferior infarction with preserved EF versus diaphragmatic attenuation given normal inferior wall motion, no evidence of ischemia.  . Hepatic  steatosis    a. Hepatic steatosis with probable early cirrhosis on CT 02/2014.  [name] History of measles, mumps, or rubella   . History of TIAs    Questionable CVA in 1989  . Hyperlipidemia   . Hypertension   . Premature atrial contraction   . Prostate cancer ([place])   . PVC's (premature ventricular contractions)   . PVD (peripheral vascular disease) ([place])    a. CT 02/2014: mod stenosis at origins of SMA and L renal artery.  . Status post carotid endarterectomy     Past Surgical History:  Procedure Laterality Date  . APPENDECTOMY    . CAROTID ENDARTERECTOMY Right 1998   cea  . EYE SURGERY  July 2012   Cataract Bilateral eye  . EYE SURGERY  Dec. 18, 2013   Right lower eyelid surgery  . HERNIA REPAIR    . PROSTATE SURGERY    . TONSILLECTOMY    . TOTAL KNEE ARTHROPLASTY     right     Inpatient Medications: Scheduled Meds: . aspirin EC  81 mg Oral Daily  . atorvastatin  40 mg Oral Daily  . enoxaparin (LOVENOX) injection  40 mg Subcutaneous Q24H  . hydrochlorothiazide  25 mg Oral Daily  . metoprolol succinate  75 mg Oral Daily   Continuous Infusions:  PRN Meds: acetaminophen, gi  cocktail, ondansetron (ZOFRAN) IV Prior to Admission medications   Medication Sig Start Date End Date Taking? Authorizing Provider  ALPRAZolam (XANAX) 0.25 MG tablet Take 0.25 mg by mouth as needed for sleep.    Yes [provider]  aspirin EC 81 MG tablet Take 81 mg by mouth 2 (two) times a week.   Yes [provider]  atorvastatin (LIPITOR) 40 MG tablet Take 40 mg by mouth daily.     Yes [provider]  hydrochlorothiazide (HYDRODIURIL) 25 MG tablet Take 1 tablet (25 mg total) by mouth daily. 04/26/14  Yes Martinique, Peter M, MD  metoprolol succinate (TOPROL-XL) 50 MG 24 hr tablet Take 1 & 1/2 tablets by mouth daily 01/15/16  Yes Martinique, Peter M, MD    Allergies:    Allergies  Allergen Reactions  . Ciprofloxacin Anxiety and Other (See Comments)    Per patient "bad sense of  well being"; can take if absolutely necessary   . Cleocin [Clindamycin Hcl] Anxiety  . Tobradex [Tobramycin-Dexamethasone] Other (See Comments)    Opthalmic-burning Opthalmic-burning  . Neomycin Other (See Comments)    other  . Clindamycin Other (See Comments) and Rash    Social History:   Social History   Social History  . Marital status: Divorced    Spouse name: N/A  . Number of children: 0  . Years of education: N/A   Occupational History  . department manager Retired   Social History Main Topics  . Smoking status: Former Smoker    Quit date: 04/14/1979  . Smokeless tobacco: Never Used  . Alcohol use 0.6 - 1.2 oz/week    1 - 2 Glasses of wine per week  . Drug use: No  . Sexual activity: Not on file   Other Topics Concern  . Not on file   Social History Narrative   Patient lives in [place] with his significant other. She suffers from dementia and he is her primary care provider. He walks 2.5 miles 5 days per week.    Family History:   The patient's family history includes Heart failure in his mother; Stroke in his father. Pt indicated that his mother is deceased. He indicated that his father is deceased.    ROS:  Please see the history of present illness.  All other ROS reviewed and negative.      Physical Exam/Data:   Vitals:   10/21/16 2005 10/21/16 2020 10/22/16 0024 10/22/16 0526  BP: (!) 141/112 (!) 155/94 109/68 94/63  Pulse: 68  62 60  Resp: 13 20 18 16   Temp: 98.1 F (36.7 C)  98.1 F (36.7 C) 98.8 F (37.1 C)  TempSrc: Oral  Oral Oral  SpO2: 98%  100% 100%  Weight:    156 lb 11.2 oz (71.1 kg)    Intake/Output Summary (Last 24 hours) at 10/22/16 0748 Last data filed at 10/22/16 0700  Gross per 24 hour  Intake              360 ml  Output             1140 ml  Net             -780 ml   Filed Weights   10/22/16 0526  Weight: 156 lb 11.2 oz (71.1 kg)   Body mass index is 22.81 kg/m.  General:  Well nourished, well developed, in no acute  distress HEENT: normal for age Lymph: no adenopathy Neck: no JVD Endocrine:  No thryomegaly Vascular: No  carotid bruits; distal pulses 2+ bilaterally   Cardiac:  normal S1, S2; RRR; no murmur  Lungs:  clear to auscultation bilaterally, no wheezing, rhonchi or rales  Abd: soft, nontender, no hepatomegaly, no abd bruit noted  Ext: no edema Musculoskeletal:  No deformities, BUE and BLE strength normal and equal Skin: warm and dry  Neuro:  CNs 2-12 intact, no focal abnormalities noted Psych:  Normal affect   EKG:  The EKG was personally reviewed and demonstrates:  SR, no sig change from 10/2015 Telemetry:  Telemetry was personally reviewed and demonstrates:  SR  Relevant CV Studies: MYOVIEW 2015 IMPRESSION: 1.  Normal LV size and systolic function. 2. Fixed medium-sized, severe basal to mid inferoseptal/inferior/inferolateral perfusion defect. This suggested possible infarction with no significant ischemia. However, normal wall motion in the inferior wall raises the possibility of diaphragmatic attenuation. 3. Low risk study overall. Inferior infarction with preserved EF versus diaphragmatic attenuation given normal inferior wall motion.  Laboratory Data:  Chemistry Recent Labs Lab 10/21/16 1035  NA 137  K 3.8  CL 102  CO2 28  GLUCOSE 98  BUN 15  CREATININE 1.10  CALCIUM 9.1  GFRNONAA 58*  GFRAA >60  ANIONGAP 7    Hematology Recent Labs Lab 10/21/16 1035  WBC 8.6  RBC 5.13  HGB 15.1  HCT 46.6  MCV 90.8  MCH 29.4  MCHC 32.4  RDW 12.9  PLT 245   Cardiac Enzymes Recent Labs Lab 10/21/16 1826 10/21/16 2254 10/22/16 0447  TROPONINI <0.03 <0.03 <0.03    Recent Labs Lab 10/21/16 1057  TROPIPOC 0.01     Radiology/Studies:  Dg Chest 2 View Result Date: 10/21/2016 CLINICAL DATA:  Chest pain EXAM: CHEST  2 VIEW COMPARISON:  04/23/2016 chest radiograph. FINDINGS: Surgical clips are noted in the medial right lower neck. Stable cardiomediastinal  silhouette with normal heart size and aortic atherosclerosis. No pneumothorax. No pleural effusion. Hyperinflated lungs. No pulmonary edema. No acute consolidative airspace disease. Mild bibasilar scarring is stable. IMPRESSION: 1. No acute cardiopulmonary disease. 2. Hyperinflated lungs, suggesting COPD. 3. Stable mild bibasilar scarring. Electronically Signed   By: Ilona Sorrel M.D.   On: 10/21/2016 10:49    Assessment and Plan:   Principal Problem:   Chest pain, moderate CAD risk: - sx were brief but pt has sig CAD risk w/ cerebrovascular dz and HTN - ez neg MI and ECG not acute - no MV in 3 years - with mult CRFs and AAA, feel MV indicated, inpatient preferred. - will order  Otherwise, per IM Active Problems:   Hypertension   Hyperlipidemia   Abdominal aortic aneurysm [facility])   Coronary artery calcification   PVD (peripheral vascular disease) ([place])    Signed, Rosaria Ferries, PA-C  10/22/2016 7:48 AM

## 2016-10-22 NOTE — Progress Notes (Addendum)
Nuclear results reviewed - study shows diaphragmatic attenuation with no ischemia.  There was normal wall motion so fixed inferior defect unlikely to represent infarct.  He has had some abdominal pain and history of AAA (measuring 4.8 x 5.3cm on study 07/2016) .  CT of abdomen has been ordered by Vascular surgery.  No further cardiac workup at this time.  Will sign off. Call with any questions.

## 2016-10-23 ENCOUNTER — Telehealth: Payer: Self-pay | Admitting: Vascular Surgery

## 2016-10-23 ENCOUNTER — Encounter (HOSPITAL_COMMUNITY): Payer: Self-pay | Admitting: Radiology

## 2016-10-23 ENCOUNTER — Observation Stay (HOSPITAL_COMMUNITY): Payer: Medicare Other

## 2016-10-23 DIAGNOSIS — I714 Abdominal aortic aneurysm, without rupture: Secondary | ICD-10-CM | POA: Diagnosis not present

## 2016-10-23 DIAGNOSIS — R0789 Other chest pain: Secondary | ICD-10-CM | POA: Diagnosis not present

## 2016-10-23 MED ORDER — IOPAMIDOL (ISOVUE-370) INJECTION 76%
INTRAVENOUS | Status: AC
  Administered 2016-10-23: 100 mL
  Filled 2016-10-23: qty 100

## 2016-10-23 NOTE — Progress Notes (Signed)
  Date: 10/23/2016  Patient name: [name]  Medical record number: [id number]  Date of birth: 05-05-1926   I have seen and evaluated this patient and I have discussed the plan of care with the house staff. Please see their note for complete details. I concur with their findings with the following additions/corrections: [name]'s chest pain on presentation is unlikely to be cardiac in origin. It was atypical in character in his stress test was low risk. Dr. Donnetta Hutching has spoken to the patient and they will continue active surveillance for his AAA. The patient is stable to be discharged to home today.  Bartholomew Crews, MD 10/23/2016, 3:41 PM

## 2016-10-23 NOTE — Progress Notes (Signed)
Patient ID: Eric Carpenter, male   DOB: 08-01-1926, 83 y.o.   MRN: [id number] Remains comfortable. Reports occasionally having pain at the level of his left nipple. This is not exertional. No abdominal pain.  Discussed the results of his CT scan of his abdomen and pelvis. This reveals maximal diameter of his aorta 5.7 cm which compares to 5.1 cm in 2015. Explained this is relatively slow growth. CT scan shows that he would be a poor candidate for endovascular treatment and I would require open aneurysm repair. Certainly feel that his surgical risk and morbidity far out ways his potential for aneurysm rupture. Explained that his estimated annual rupture rate would be approximately 5%. Recommend continued surveillance in our office. We'll see him again in 6 months with ultrasound of his aorta. He is comfortable with this discussion. Will not follow actively. Please call if we can assist

## 2016-10-23 NOTE — Progress Notes (Addendum)
Reviewed discharge instructions with patient and he stated his understanding. Patient unable to get in touch with transportation home.  Awaiting Clinical Social Worker to assist with transportation home.  Sanda Linger  I spoke with patient's friend, Santiago Glad who is willing to come pick patient up.  Will await her arrival.  Sanda Linger

## 2016-10-23 NOTE — Plan of Care (Signed)
Problem: Safety: Goal: Ability to remain free from injury will improve Outcome: Progressing Bed alarm on, personal items within reach, call light and phone within reach, urinal at bedside.  Up to bathroom with staff assistance.  Problem: Pain Managment: Goal: General experience of comfort will improve Outcome: Progressing Denies c/o pain at this time.  Problem: Physical Regulation: Goal: Ability to maintain clinical measurements within normal limits will improve Outcome: Progressing VSS

## 2016-10-23 NOTE — Telephone Encounter (Signed)
-----   Message from Mena Goes, RN sent at 10/23/2016  2:40 PM EDT ----- Regarding: canc October appt and make it for 6 months   ----- Message ----- From: Rosetta Posner, MD Sent: 10/23/2016   2:18 PM To: [facility], [facility] follow-up on Friday. Does not need to stay on the list. He probably has an appointment to see me in October. This can be discontinued and I will see him in 6 months from now with ultrasound of his aneurysm follow-up

## 2016-10-23 NOTE — Progress Notes (Signed)
   Subjective: Eric Carpenter was doing well today with no new complaints. He denied any sob or headaches. He states that he has been feeling like his heart has been racing. He also states that he has been having some dizziness, but he has had this on and off for awhile now.   Objective:  Vital signs in last 24 hours: Vitals:   10/22/16 1500 10/22/16 2042 10/23/16 0700 10/23/16 1318  BP: 124/63 123/71 124/67 128/60  Pulse: 66 63 60 63  Resp: 18 16 17 16   Temp: 97.7 F (36.5 C) 97.8 F (36.6 C) 97.9 F (36.6 C) 97.7 F (36.5 C)  TempSrc: Axillary Axillary  Oral  SpO2: 94% 95%  97%  Weight:   155 lb (70.3 kg)   Height: 5\' 9"  (1.753 m)      Physical Exam  Constitutional: He appears well-developed and well-nourished. No distress.  HENT:  Head: Normocephalic and atraumatic.  Cardiovascular: Normal rate, regular rhythm, normal heart sounds and intact distal pulses.   Pulmonary/Chest: Effort normal and breath sounds normal. No respiratory distress.  Abdominal: Soft. Bowel sounds are normal. He exhibits no distension.  Psychiatric: He has a normal mood and affect. His behavior is normal. Judgment and thought content normal.   Assessment/Plan: Atypical chest pain The patient's pain is likely to be cardiac in etiology due to his risk factors of htn, age, sex, pvd, AAA, coronary artery calcification, and hyperlipidemia. -CT scan shows increase in infrarenal aortic aneurysm to 5.7cm in diameter, aneurysmal dilatation of common iliac arteries. -Troponin negative x3 -EKG on admission showed sinus rhythm with lad and 1st degree av block. Repeat ekg did not show any changes.  -NM myocardial stress test showed not ST segment deviation, ef of 72%, large defect in the inferior location likely to indicate an inferior infarct. Diaphragmatic attenuation without ischemia is present per cardiology evaluation. -Aspirin 81, atorvastatin, and metoprolol 75mg  given for prevention of MI  Hypertension -BP  over the past 24 hrs 123-135/71-82 -Managed with hctz 25mg  qd, metoprolol 75mg  qd  Hyperlipidemia Continue on home atorvastatin 40mg   Abdominal aortic aneurysm ([facility]) -US of the abdomen showed infrarenal abdominal aortic aneurysm measuring 5.6cm which has increased from his previous 5.1cm.  -CT of abdomen confirmed the increase in infrarenal aortic aneurysm to 5.7cm in diameter. He also has aneurysmal dilatation of common iliac arteries. Other non-vascular findings are sigmoid and descending diverticulosis and possible cholelitiasis. -Vascular surgery stated that due to the slow progression of his aneurysm, surgical risk, and chances of intra/post operative mobidity it is not necessary for him to get repair.    Dispo: Anticipated discharge today.   Lars Mage, MD Internal Medicine PGY1 [phone] 10/23/2016, 3:03 PM

## 2016-10-23 NOTE — Discharge Instructions (Signed)
Abdominal Aortic Aneurysm An aneurysm is a bulge in an artery. It happens when blood pushes up against a weakened or damaged artery wall. An abdominal aortic aneurysm is an aneurysm that occurs in the lower part of the aorta, the main artery of the body. The aorta supplies blood from the heart to the rest of the body. Some aneurysms may not cause symptoms or problems. However, the major concern with an abdominal aortic aneurysm is that it can enlarge and burst (rupture), or that blood can flow between the layers of the wall of the aorta through a tear (aorticdissection). Both of these conditions can cause bleeding inside the body and can be life-threatening unless diagnosed and treated right away. What are the causes? The exact cause of this condition is not known. What increases the risk? The following factors may make you more likely to develop this condition:  Being older than age 75.  Having a hardening of the arteries caused by the buildup of fat and other substances in the lining of a blood vessel (arteriosclerosis).  Having inflammation of the walls of an artery (arteritis).  Having a genetic disease that weakens the body's connective tissue, such as Marfan syndrome.  Having abdominal trauma.  Having an infection, such as syphilis or staphylococcus, in the wall of the aorta (infectious aortitis) caused by bacteria.  Having high blood pressure (hypertension).  Being male.  Being white (Caucasian).  Having high cholesterol.  Having a family history of aneurysms.  Using tobacco.  Having chronic obstructive pulmonary disease (COPD).  What are the signs or symptoms? Symptoms of this condition vary depending on the size and rate of growth of the aneurysm.Most aneurysms grow slowly and do not cause any symptoms. When symptoms do occur, they may include:  Pain in the abdomen, side, or lower back. The pain may vary in intensity.  Feeling full after eating only small amounts of  food.  Feeling a pulsating lump in the abdomen.  Symptoms that the aneurysm has ruptured include:  A sudden onset of severe pain in the abdomen, side, or lower back.  Nausea or vomiting.  Feeling faint or passing out.  How is this diagnosed? This condition may be diagnosed with:  A physical exam. During the exam, your health care provider will check for throbbing in your abdomen. He or she may also listen to the blood flow in your abdomen.  Tests, such as: ? An ultrasound. ? X-rays. ? A CT scan. ? An MRI. ? Tests to check your arteries for damage or blockage (angiogram).  Because most unruptured abdominal aortic aneurysms cause no symptoms, they are often found during exams for other conditions. How is this treated? Treatment for this condition depends on:  The size of the aneurysm.  How fast the aneurysm is growing.  Your age.  Risk factors for rupture.  Aneurysms that are smaller than 2 inches (5 cm) may be managed by using medicines to control blood pressure, manage pain, or fight infection. You may need regular monitoring to see if the aneurysm is getting bigger. Your health care provider may recommend that you have an ultrasound every few years, every year, or every 3-6 months. How often you need to have an ultrasound depends on the size of the aneurysm, how fast it is growing, and whether you have a family history of aneurysms. Surgical repair may be needed if your aneurysm is larger than 2 inches (5 cm). Follow these instructions at home: General instructions  Keep all  follow-up visits as told by your health care provider. This is important. ? Talk to your health care provider about regular screenings to see if the aneurysm is getting bigger.  Take over-the-counter and prescription medicines only as told by your health care provider.  Avoid heavy lifting and activities that take a lot of effort (are strenuous). Ask your health care provider what activities are  safe for you. Lifestyle Follow instructions from your health care provider about healthy lifestyle habits. Your health care provider may recommend:  Not using any products that contain nicotine or tobacco, such as cigarettes and e-cigarettes. If you need help quitting, ask your health care provider.  Limiting or avoiding alcohol.  Keeping your blood pressure within normal limits. The target limit for most people is below 120/80. Check your blood pressure regularly. If it is high, ask your health care provider about ways that you can control it.  Keeping your blood sugar level and cholesterol levels within normal limits. Target limits for most people are: ? Blood sugar level: Less than 100 mg/dL. ? Total cholesterol level: Less than 200 mg/dL.  Eating a healthy diet. This may include: ? Lowering your salt (sodium) intake. In some people, too much salt can raise blood pressure and increase the risk of abdominal aortic aneurysm. ? Avoiding foods that are high in saturated fat and cholesterol, such as red meat and dairy products. ? Eating a diet that is low in sugar. ? Increasing your fiber intake by including whole grains, vegetables, and fruits in your diet. Eating these foods may help to lower blood pressure.  Maintaining a healthy weight.  Staying physically active and exercising regularly. Talk with your health care provider about how often you should exercise and which types of exercise are safe for you.  Contact a health care provider if:  You have pain in your abdomen, side, or lower back.  You have a throbbing feeling in your abdomen.  You have a family history of aneurysms. Get help right away if:  You have sudden, severe pain in your abdomen or lower back.  You experience nausea or vomiting.  You have constipation or problems urinating.  You feel light-headed.  You have a rapid heart rate when you stand.  You have sweaty, clammy skin.  You have shortness of  breath.  You have a fever. This information is not intended to replace advice given to you by your health care provider. Make sure you discuss any questions you have with your health care provider. Document Released: 01/07/2005 Document Revised: 10/23/2015 Document Reviewed: 09/17/2015 Elsevier Interactive Patient Education  2018 [employer].     Nonspecific Chest Pain Chest pain can be caused by many different conditions. There is always a chance that your pain could be related to something serious, such as a heart attack or a blood clot in your lungs. Chest pain can also be caused by conditions that are not life-threatening. If you have chest pain, it is very important to follow up with your health care provider. What are the causes? Causes of this condition include:  Heartburn.  Pneumonia or bronchitis.  Anxiety or stress.  Inflammation around your heart (pericarditis) or lung (pleuritis or pleurisy).  A blood clot in your lung.  A collapsed lung (pneumothorax). This can develop suddenly on its own (spontaneous pneumothorax) or from trauma to the chest.  Shingles infection (varicella-zoster virus).  Heart attack.  Damage to the bones, muscles, and cartilage that make up your chest wall. This  can include: ? Bruised bones due to injury. ? Strained muscles or cartilage due to frequent or repeated coughing or overwork. ? Fracture to one or more ribs. ? Sore cartilage due to inflammation (costochondritis).  What increases the risk? Risk factors for this condition may include:  Activities that increase your risk for trauma or injury to your chest.  Respiratory infections or conditions that cause frequent coughing.  Medical conditions or overeating that can cause heartburn.  Heart disease or family history of heart disease.  Conditions or health behaviors that increase your risk of developing a blood clot.  Having had chicken pox (varicella zoster).  What are the  signs or symptoms? Chest pain can feel like:  Burning or tingling on the surface of your chest or deep in your chest.  Crushing, pressure, aching, or squeezing pain.  Dull or sharp pain that is worse when you move, cough, or take a deep breath.  Pain that is also felt in your back, neck, shoulder, or arm, or pain that spreads to any of these areas.  Your chest pain may come and go, or it may stay constant. How is this diagnosed? Lab tests or other studies may be needed to find the cause of your pain. Your health care provider may have you take a test called an ECG (electrocardiogram). An ECG records your heartbeat patterns at the time the test is performed. You may also have other tests, such as:  Transthoracic echocardiogram (TTE). In this test, sound waves are used to create a picture of the heart structures and to look at how blood flows through your heart.  Transesophageal echocardiogram (TEE).This is a more advanced imaging test that takes images from inside your body. It allows your health care provider to see your heart in finer detail.  Cardiac monitoring. This allows your health care provider to monitor your heart rate and rhythm in real time.  Holter monitor. This is a portable device that records your heartbeat and can help to diagnose abnormal heartbeats. It allows your health care provider to track your heart activity for several days, if needed.  Stress tests. These can be done through exercise or by taking medicine that makes your heart beat more quickly.  Blood tests.  Other imaging tests.  How is this treated? Treatment depends on what is causing your chest pain. Treatment may include:  Medicines. These may include: ? Acid blockers for heartburn. ? Anti-inflammatory medicine. ? Pain medicine for inflammatory conditions. ? Antibiotic medicine, if an infection is present. ? Medicines to dissolve blood clots. ? Medicines to treat coronary artery disease  (CAD).  Supportive care for conditions that do not require medicines. This may include: ? Resting. ? Applying heat or cold packs to injured areas. ? Limiting activities until pain decreases.  Follow these instructions at home: Medicines  If you were prescribed an antibiotic, take it as told by your health care provider. Do not stop taking the antibiotic even if you start to feel better.  Take over-the-counter and prescription medicines only as told by your health care provider. Lifestyle  Do not use any products that contain nicotine or tobacco, such as cigarettes and e-cigarettes. If you need help quitting, ask your health care provider.  Do not drink alcohol.  Make lifestyle changes as directed by your health care provider. These may include: ? Getting regular exercise. Ask your health care provider to suggest some activities that are safe for you. ? Eating a heart-healthy diet. A registered dietitian  can help you to learn healthy eating options. ? Maintaining a healthy weight. ? Managing diabetes, if necessary. ? Reducing stress, such as with yoga or relaxation techniques. General instructions  Avoid any activities that bring on chest pain.  If heartburn is the cause for your chest pain, raise (elevate) the head of your bed about 6 inches (15 cm) by putting blocks under the legs. Sleeping with more pillows does not effectively relieve heartburn because it only changes the position of your head.  Keep all follow-up visits as told by your health care provider. This is important. This includes any further testing if your chest pain does not go away. Contact a health care provider if:  Your chest pain does not go away.  You have a rash with blisters on your chest.  You have a fever.  You have chills. Get help right away if:  Your chest pain is worse.  You have a cough that gets worse, or you cough up blood.  You have severe pain in your abdomen.  You have severe  weakness.  You faint.  You have sudden, unexplained chest discomfort.  You have sudden, unexplained discomfort in your arms, back, neck, or jaw.  You have shortness of breath at any time.  You suddenly start to sweat, or your skin gets clammy.  You feel nauseous or you vomit.  You suddenly feel light-headed or dizzy.  Your heart begins to beat quickly, or it feels like it is skipping beats. These symptoms may represent a serious problem that is an emergency. Do not wait to see if the symptoms will go away. Get medical help right away. Call your local emergency services (911 in the [place] Do not drive yourself to the hospital. This information is not intended to replace advice given to you by your health care provider. Make sure you discuss any questions you have with your health care provider. Document Released: 01/07/2005 Document Revised: 12/23/2015 Document Reviewed: 12/23/2015 Elsevier Interactive Patient Education  2017 [employer].

## 2016-10-23 NOTE — Telephone Encounter (Signed)
Sched appt 04/27/17; lab at 8:00 and MD at 8:30. Mailed new appt letter.

## 2016-10-23 NOTE — Progress Notes (Signed)
Subjective: Eric Carpenter is an 83yo M with known vascular disease who presents for evaluation of L sided chest pain. He reports feeling well and in no physical distress. Denies chest pain, SOB, diaphoresis. Reports feeling a bit worried about his AAA prognosis, understandably. He is looking forward to talking to his vascular surgeon, Dr. Donnetta Hutching, today.   Objective: Vital signs in last 24 hours: Vitals:   10/22/16 1500 10/22/16 2042 10/23/16 0700 10/23/16 1318  BP: 124/63 123/71 124/67 128/60  Pulse: 66 63 60 63  Resp: 18 16 17 16   Temp: 97.7 F (36.5 C) 97.8 F (36.6 C) 97.9 F (36.6 C) 97.7 F (36.5 C)  TempSrc: Axillary Axillary  Oral  SpO2: 94% 95%  97%  Weight:   70.3 kg (155 lb)   Height: 5\' 9"  (1.753 m)      Weight change: -0.771 kg (-1 lb 11.2 oz)  Intake/Output Summary (Last 24 hours) at 10/23/16 1421 Last data filed at 10/23/16 0800  Gross per 24 hour  Intake           1602.5 ml  Output             1020 ml  Net            582.5 ml   BP 128/60 (BP Location: Right Arm)   Pulse 63   Temp 97.7 F (36.5 C) (Oral)   Resp 16   Ht 5\' 9"  (1.753 m)   Wt 70.3 kg (155 lb)   SpO2 97%   BMI 22.89 kg/m  General appearance: alert, cooperative and appears stated age Chest wall: no tenderness Heart: regular rate and rhythm, S1, S2 normal, no murmur, click, rub or gallop Abdomen: soft, non-tender; bowel sounds normal; no masses,  no organomegaly Extremities: extremities normal, atraumatic, no cyanosis or edema Skin: Skin color, texture, turgor normal. No rashes or lesions  Studies/Results: Nm Myocar Multi W/spect W/wall Motion / Ef  Result Date: 10/22/2016  There was no ST segment deviation noted during stress.  Defect 1: There is a large defect of severe severity present in the basal inferior, mid inferior and apical inferior location.  This is a low risk study.  The left ventricular ejection fraction is hyperdynamic (>65%).  Nuclear stress EF: 72%.  Low risk, abnormal  stress nuclear study with prior inferior MI vs prominent diaphragmatic attenuation; no ischemia; EF 72 with normal wall motion.   US Aorta  Result Date: 10/22/2016 CLINICAL DATA:  AAA EXAM: ULTRASOUND OF ABDOMINAL AORTA TECHNIQUE: Ultrasound examination of the abdominal aorta was performed to evaluate for abdominal aortic aneurysm. COMPARISON:  CT 03/04/2014 FINDINGS: Abdominal aortic measurements as follows: Proximal:  2.9  cm Mid:  Obscured by gas Distal:  5.6  cm IMPRESSION: Infrarenal abdominal aortic aneurysm measuring up to 5.6 cm with moderate mural plaque. This is increased in size when compared to prior CT when this measured maximally 5.1 cm. Vascular surgery consultation recommended due to increased risk of rupture for AAA >5.5 cm. This recommendation follows ACR consensus guidelines: White Paper of the ACR Incidental Findings Committee II Carpenter Vascular Findings. J Am Coll Radiol 2013; 10:789-794. Electronically Signed   By: Rolm Baptise M.D.   Carpenter: 10/22/2016 11:26   Ct Angio Abdomen Pelvis  W &/or Wo Contrast  Result Date: 10/23/2016 CLINICAL DATA:  AAA. Pt denies abd pain or complaints. Pt has chronic low back pain. Carpenter 08/08/2016 Duplex of his aorta shows a continued slow increase in size now up to 5.3 cm  with a maximal diameter in June 2017 predicted at 4.8 cm. He presented to the ED with CP. He also did not have any associated factors like dyspnea or diaphoresis. no complaints of abdominal pain or new lumbar pain at this time. a right hip that radiates to his scapula last for about 30 seconds and then goes away. He states this has been happening for many years. EXAM: CTA ABDOMEN AND PELVIS WITH CONTRAST TECHNIQUE: Multidetector CT imaging of the abdomen and pelvis was performed using the standard protocol during bolus administration of intravenous contrast. Multiplanar reconstructed images and MIPs were obtained and reviewed to evaluate the vascular anatomy. CONTRAST:  100 ml iso 370. COMPARISON:   03/04/2014 FINDINGS: VASCULAR Coronary calcifications. Aorta: Moderate calcified atheromatous plaque in the visualized distal descending thoracic and suprarenal segments. Fusiform infrarenal aneurysm measuring 5.7 x 4.6 cm maximum transverse dimensions, previously 5.1 x 4.2 by my measurement. Aorta tapers to a diameter of 2.2 cm above its bifurcation. There is nonocclusive mural thrombus in the aneurysmal segment. No dissection or stenosis. Celiac: Calcified ostial plaque without high-grade stenosis, patent distally SMA: Calcified ostial plaque resulting in short segment stenosis of possible hemodynamic significance, patent distally with classic distal branch anatomy. Renals: Single left, with calcified ostial plaque resulting in short segment stenosis of at least mild severity, patent distally. Duplicated Carpenter the right. Superior is diminutive, supplying a portion of the upper pole. Inferior is dominant, with partially calcified ostial plaque resulting in no high-grade stenosis, patent distally. IMA: Patent, arising below the aneurysmal segment. Inflow: Fusiform dilatation of bilateral common iliac arteries measured up to a 2 cm diameter right, 18 mm left. External and internal iliac arteries Are ectatic. Mild tortuosity of the iliac arterial systems. No dissection or stenosis. Proximal Outflow: Mild nonocclusive calcified plaque. Veins: Dedicated venous phase imaging not obtained. Review of the MIP images confirms the above findings. NON-VASCULAR Lower chest: Emphysematous changes in the lung bases. No pleural or pericardial effusion. Hepatobiliary: Soft tissue attenuation in the inferior aspect of the nondilated gallbladder may represent noncalcified stone versus polyp. No liver lesion. No biliary ductal dilatation. Pancreas: Unremarkable. No pancreatic ductal dilatation or surrounding inflammatory changes. Spleen: Normal in size without focal abnormality. Adrenals/Urinary Tract: Adrenal glands are unremarkable.  Kidneys are normal, without renal calculi, focal lesion, or hydronephrosis. Bladder is unremarkable. Stomach/Bowel: Stomach is within normal limits. Appendix not identified. Scattered diverticula from the distal descending and sigmoid portions of the colon. No evidence of bowel wall thickening, distention, or inflammatory changes. Lymphatic:  No enlarged abdominal or pelvic lymph nodes. Reproductive: Previous prostatectomy. Multiple surgical clips along the pelvic sidewalls. Other: No ascites.  No free air. 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 No fracture or other worrisome bone lesion. IMPRESSION: VASCULAR 1. Increase in infrarenal aortic aneurysm to 5.7 cm diameter (previously 5.1 cm 03/04/2014). 2. 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 No acute findings. 2. Pulmonary emphysema 3. Possible cholelithiasis. 4. Descending and sigmoid diverticulosis. Electronically Signed   By: Lucrezia Europe M.D.   Carpenter: 10/23/2016 10:10   Medications: I have reviewed the patient's current medications. Scheduled Meds: . aspirin EC  81 mg Oral Daily  . atorvastatin  40 mg Oral Daily  . enoxaparin (LOVENOX) injection  40 mg Subcutaneous Q24H  . hydrochlorothiazide  25 mg Oral Daily  . metoprolol succinate  75 mg Oral Daily   Continuous Infusions: . sodium chloride 75 mL/hr at 10/23/16 [phone]  PRN Meds:.acetaminophen, gi cocktail, ondansetron (ZOFRAN) IV Assessment/Plan: Principal Problem:   Chest pain Active Problems:   Hypertension   Hyperlipidemia   Abdominal aortic aneurysm (HCC)   Coronary artery calcification   PVD (peripheral vascular disease) (HCC)   Atypical chest pain  Eric Carpenter is an 83yo M with known vascular disease who presents for evaluation of L sided chest pain. Carpenter exam today, he was pleasant and feeling well.  Chest pain Chest pain etiology unclear, but cardiac causes ruled out. Pt stable and in no distress. No  physical complaints today. - Troponin x3 <0.03 - Repeat EKG wnl, unchanged - Stress test unremarkable - Discharge today.  HTN BP stable. Continue home metoprolol and hydrochlorothiazide.  Hyperlipidemia Manage Carpenter home statin.  Abdominal aortic aneurysm  - CT Angio Abdomen: Increase in infrarenal aortic aneurysm to 5.7 cm diameter (previously 5.1 cm 03/04/2014). - FU with vascular surgeon, Dr. Sherren Mocha Early - Continue home medications  Coronary artery calcification PVD (peripheral vascular disease) Stable. Manage Carpenter home medications.   Dispo: Expected discharge today to home.  This is a Careers information officer Note.  The care of the patient was discussed with Dr. Lynnae January and the assessment and plan formulated with their assistance.  Please see their attached note for official documentation of the daily encounter.   LOS: 0 days   Murlean Caller, Medical Student 10/23/2016, 2:21 PM  Pager: [phone]

## 2016-10-24 NOTE — Discharge Summary (Signed)
Name: Eric Carpenter MRN: [id number] DOB: 06/12/1926 83 y.o. PCP: Dr. Martinique and Dr. Donnetta Hutching  Date of Admission: 10/21/2016 10:31 AM Date of Discharge: 10/24/2016 Attending Physician: Dr. Lynnae January  Discharge Diagnosis: Principal Problem:   Atypical Chest pain Active Problems:   Hypertension   Hyperlipidemia   Abdominal aortic aneurysm [facility])   Coronary artery calcification   PVD (peripheral vascular disease) ([place])   Atypical chest pain   Discharge Medications: Allergies as of 10/23/2016      Reactions   Ciprofloxacin Anxiety, Other (See Comments)   Per patient "bad sense of well being"; can take if absolutely necessary    Cleocin [clindamycin Hcl] Anxiety   Tobradex [tobramycin-dexamethasone] Other (See Comments)   Opthalmic-burning Opthalmic-burning   Neomycin Other (See Comments)   other   Clindamycin Other (See Comments), Rash      Medication List    TAKE these medications   ALPRAZolam 0.25 MG tablet Commonly known as:  XANAX Take 0.25 mg by mouth as needed for sleep.   aspirin EC 81 MG tablet Take 81 mg by mouth 2 (two) times a week.   atorvastatin 40 MG tablet Commonly known as:  LIPITOR Take 40 mg by mouth daily.   hydrochlorothiazide 25 MG tablet Commonly known as:  HYDRODIURIL Take 1 tablet (25 mg total) by mouth daily.   metoprolol succinate 50 MG 24 hr tablet Commonly known as:  TOPROL-XL Take 1 & 1/2 tablets by mouth daily       Disposition and follow-up:   Eric Carpenter was discharged from [facility] in good condition.  At the hospital follow up visit please address:  1.  Abdominal aortic aneurysm, ekg   2.  Labs / imaging needed at time of follow-up:   3.  Pending labs/ test needing follow-up: none  Follow-up Appointments: Follow-up Information    Martinique, Peter M, MD Follow up on 11/02/2016.   Specialty:  Cardiology Why:  10:20 am for hospital follow up Contact information: [address] [place] [address] [phone]        Rosetta Posner, MD Follow up.   Specialties:  Vascular Surgery, Cardiology Contact information: [place] [address] [place] Hospital Course by problem list:  Atypical Chest pain The patient stated that he has chest pain that was stabbing in nature, constant, lasting 20 min, not accompanied with diaphoresis or sob. EKG did not show any ST changes, but did indicate 1st degree av block. The patient's troponins were  negative x3. Cardiology was consulted who recommended nuclear stress test. The stress test indicated low risk of MI, no ST segment deviation during stress, prior inferior wall MI and an EF of 72%. Therefore, it is likely that the patient's chest pain was atypical and not cardiac in origin. We continued on home aspirin 81, atorvastatin, metoprolol 75mg , hctz 25mg  qd. We also added GI cocktail to cover for any GI causes of chest pain. The patient denied any further chest pain during his hospital admission and at [place] he continued to deny any chest pain.  Hypertension The patient's blood pressure was controlled during his hospital stay with hctz 25mg  qd and metoprolol 75mg .   Hyperlipidemia Continued with atorvastatin 40mg   Abdominal aortic aneurysm ([place]) US of the aorta found an infrarenal abdominal aortic aneurysm up to 5.6cm with  CT further confirmed an increase in aortic aneurysm size to 5.7 from prior 5.1  in 03/04/14. Vascular surgery recommended against open aneurysm repair, but they will continue following him outpatient.   Discharge Vitals:   BP 128/60 (BP Location: Right Arm)   Pulse 63   Temp 97.7 F (36.5 C) (Oral)   Resp 16   Ht 5\' 9"  (1.753 m)   Wt 155 lb (70.3 kg)   SpO2 97%   BMI 22.89 kg/m   Pertinent Labs, Studies, and Procedures:  US Aorta (7/12):  Infrarenal abdominal aortic aneurysm measuring up to 5.6 cm with moderate mural plaque. This is increased in size when  compared to prior CT when this measured maximally 5.1 cm. Vascular surgery consultation recommended due to increased risk of rupture for AAA >5.5 cm.  NM Myocar Multi (7/12):  There was no ST segment deviation noted during stress.  Defect 1: There is a large defect of severe severity present in the basal inferior, mid inferior and apical inferior location.  This is a low risk study.  The left ventricular ejection fraction is hyperdynamic (>65%).  Nuclear stress EF: 72%.   Low risk, abnormal stress nuclear study with prior inferior MI vs prominent diaphragmatic attenuation; no ischemia; EF 72 with normal wall motion.  CT Angio abdomen pelvis (7/13): VASCULAR 1. Increase in infrarenal aortic aneurysm to 5.7 cm diameter (previously 5.1 cm 03/04/2014). 2. Fusiform aneurysmal dilatation of common iliac arteries, 2 cm right, 1.8 cm left  NON-VASCULAR 1. No acute findings. 2. Pulmonary emphysema 3. Possible cholelithiasis. 4. Descending and sigmoid diverticulosis.  EKG (7/10): Sinus bradycardia with 1st degree av block    Discharge Instructions: Discharge Instructions    (HEART FAILURE PATIENTS) Call MD:  Anytime you have any of the following symptoms: 1) 3 pound weight gain in 24 hours or 5 pounds in 1 week 2) shortness of breath, with or without a dry hacking cough 3) swelling in the hands, feet or stomach 4) if you have to sleep on extra pillows at night in order to breathe.    Complete by:  As directed    Call MD for:  difficulty breathing, headache or visual disturbances    Complete by:  As directed    Call MD for:  persistant dizziness or light-headedness    Complete by:  As directed    Diet - low sodium heart healthy    Complete by:  As directed    Discharge instructions    Complete by:  As directed    Thank you for allowing [place] to be a part of your care. You were admitted to [facility] for chest pain. During your admission you had a stress test which showed  some decreased blood supply to your heart. An abdominal ultrasound was done which showed an enlargement of your abdominal aortic aneurysm. If you notice any chest pain, shortness of breath, dizziness, lightheadedness, extreme abdominal pain, excessive sweating please see a doctor immediately.   Increase activity slowly    Complete by:  As directed       Signed: Lars Mage, MD 10/24/2016, 7:11 PM   Pager: Pager: [phone]

## 2016-10-28 NOTE — Progress Notes (Deleted)
Frutoso Schatz Waters Date of Birth: March 01, 1927   History of Present Illness: [name] is seen today for followup AAA and HTN. He was admitted in November with back and chest pain. He had a CT abd/pelvis which showed some increase in AAA. This was reviewed by Dr. Donnetta Hutching who felt it was 4.5 cm in diameter.Follow up in April 2018  showed size of 4.8 x 5.3 cm.   Stress Myoview in November 2015 showed inferior attenuation without ischemia. EF was normal.  Carotid dopplers in April 9242 showed 68-34% LICA stenosis and patent RCEA.   He was recently admitted July 11-14 for evaluation of chest pain. Cardiac enzymes were negative. Myoview was low risk with inferior infarct versus attenuation artifact. No ischemia. Seen by vascular surgery for his AAA. This was felt to be slow growing. Size was 5.7 cm. CT was not favorable for endovascular repair and it was felt that his surgical risk was high. Follow up in 6 months recommended.   He was seen in the ED early this am for evaluation of abdominal pain. CT was stable.   Last year we made a number of changes in his BP medications due to tolerability issues.  His losartan was stopped due to dizziness and amlodipine was reduced without improvement in his symptoms. He complains of pain all over including his feet and head. He loses his balance easily especially in the dark. Due to elevated BP his metoprolol was increased and he was started on HCTZ. He  is under a lot of stress being the caretaker of his wife.    Current Outpatient Prescriptions on File Prior to Visit  Medication Sig Dispense Refill  . ALPRAZolam (XANAX) 0.25 MG tablet Take 0.25 mg by mouth as needed for sleep.     [name] aspirin EC 81 MG tablet Take 81 mg by mouth 2 (two) times a week.    [name] atorvastatin (LIPITOR) 40 MG tablet Take 40 mg by mouth daily.      . hydrochlorothiazide (HYDRODIURIL) 25 MG tablet Take 1 tablet (25 mg total) by mouth daily. 30 tablet 6  . metoprolol succinate (TOPROL-XL) 50  MG 24 hr tablet Take 1 & 1/2 tablets by mouth daily 45 tablet 11   No current facility-administered medications on file prior to visit.     Allergies  Allergen Reactions  . Ciprofloxacin Anxiety and Other (See Comments)    Per patient "bad sense of well being"; can take if absolutely necessary   . Cleocin [Clindamycin Hcl] Anxiety  . Tobradex [Tobramycin-Dexamethasone] Other (See Comments)    Opthalmic-burning Opthalmic-burning  . Neomycin Other (See Comments)    other  . Clindamycin Other (See Comments) and Rash    Past Medical History:  Diagnosis Date  . AAA (abdominal aortic aneurysm) ([place])   . Abdominal aortic aneurysm ([place])    a. 03/2013 Abd Duplex: AAA 4.2 x 4.2 cm. b. 02/2014: CTA no dissection; increase in aneurysm to 5.1cm,  . Carotid artery occlusion    a. s/p R CEA 1998;  b. 03/2013 U/S: RICA < 19%, LICA 62-22%.  . Chest pain 10/21/2016  . GERD (gastroesophageal reflux disease)   . H/O cardiovascular stress test    a. Nuc 02/2014:  EF 79%, inferior infarction with preserved EF versus diaphragmatic attenuation given normal inferior wall motion, no evidence of ischemia.  . Hepatic steatosis    a. Hepatic steatosis with probable early cirrhosis on CT 02/2014.  [name] History of measles, mumps, or rubella   .  History of TIAs    Questionable CVA in 1989  . Hyperlipidemia   . Hypertension   . Premature atrial contraction   . Prostate cancer ([place])   . PVC's (premature ventricular contractions)   . PVD (peripheral vascular disease) ([place])    a. CT 02/2014: mod stenosis at origins of SMA and L renal artery.  . Status post carotid endarterectomy     Past Surgical History:  Procedure Laterality Date  . APPENDECTOMY    . CAROTID ENDARTERECTOMY Right 1998   cea  . EYE SURGERY  July 2012   Cataract Bilateral eye  . EYE SURGERY  Dec. 18, 2013   Right lower eyelid surgery  . HERNIA REPAIR    . PROSTATE SURGERY    . TONSILLECTOMY    . TOTAL KNEE ARTHROPLASTY     right     History  Smoking Status  . Former Smoker  . Packs/day: 1.00  . Years: 20.00  . Types: Cigarettes  . Quit date: 04/14/1979  Smokeless Tobacco  . Never Used    History  Alcohol Use  . 0.6 oz/week  . 1 Standard drinks or equivalent per week    Family History  Problem Relation Age of Onset  . Stroke Father   . Hypertension Father   . Hyperlipidemia Father   . Heart failure Mother     Review of Systems: As noted in history of present illness.  All other systems were reviewed and are negative.  Physical Exam: There were no vitals taken for this visit. He is a pleasant white male in no acute distress. HEENT exam is normal. Neck is supple no JVD adenopathy thyromegaly or bruits. He has an old right carotid endarterectomy scar. Lungs are clear. Cardiac exam reveals a regular rate and rhythm  without gallop, murmur, or click. Abdomen is soft and nontender without masses or bruits. Extremities are without edema. He has good pedal pulses. He is alert and oriented x3. Cranial nerves II through XII are intact.   LABORATORY DATA: Lab Results  Component Value Date   WBC 8.6 10/21/2016   HGB 15.1 10/21/2016   HCT 46.6 10/21/2016   PLT 245 10/21/2016   GLUCOSE 98 10/21/2016   CHOL 175 03/05/2014   TRIG 135 03/05/2014   HDL 38 (L) 03/05/2014   LDLCALC 110 (H) 03/05/2014   ALT 15 03/05/2014   AST 20 03/05/2014   NA 137 10/21/2016   K 3.8 10/21/2016   CL 102 10/21/2016   CREATININE 1.10 10/21/2016   BUN 15 10/21/2016   CO2 28 10/21/2016   INR 1.96 (H) 07/13/2009    Ecg today shows NSR with first degree AV block. Otherwise normal. I have personally reviewed and interpreted this study.  Myoview 10/22/16: Study Result     There was no ST segment deviation noted during stress.  Defect 1: There is a large defect of severe severity present in the basal inferior, mid inferior and apical inferior location.  This is a low risk study.  The left ventricular ejection fraction is  hyperdynamic (>65%).  Nuclear stress EF: 72%.   Low risk, abnormal stress nuclear study with prior inferior MI vs prominent diaphragmatic attenuation; no ischemia; EF 72 with normal wall motion.   CT abdomen 11/02/16: CLINICAL DATA:  Right lower quadrant pain, onset tonight, radiating to the groin.  EXAM: CTA ABDOMEN AND PELVIS wITHOUT AND WITH CONTRAST  TECHNIQUE: Multidetector CT imaging of the abdomen and pelvis was performed using the standard protocol during  bolus administration of intravenous contrast. Multiplanar reconstructed images and MIPs were obtained and reviewed to evaluate the vascular anatomy.  CONTRAST:  80 mL Isovue 370 intravenous  COMPARISON:  10/23/2016  FINDINGS: VASCULAR  Aorta: Extensive calcified plaque. Infrarenal abdominal aortic aneurysm is unchanged from 10/23/2016, 5.7 cm maximum diameter. No dissection. No rupture. No retroperitoneal hemorrhage.  Celiac: Calcified plaque at the origin. Patent without evidence of aneurysm, dissection, vasculitis or significant stenosis.  SMA: Calcified plaque at the origin. Patent without evidence of aneurysm, dissection, vasculitis or significant stenosis.  Renals: Calcified plaque at the origins. Accessory superior pole right renal artery. All renal arteries are patent without evidence of aneurysm, dissection, vasculitis, fibromuscular dysplasia or significant stenosis.  IMA: Patent without evidence of aneurysm, dissection, vasculitis or significant stenosis.  Inflow: Unchanged aneurysmal enlargement of both common iliac arteries, approximately 2 cm each. No dissection or rupture.  Proximal Outflow: Extensive calcified plaque without stenosis or dissection. Mild aneurysmal enlargement at the distal common femoral artery bilaterally, 1.4 cm on the right and 1.3 cm on the left.  Veins: No obvious venous abnormality within the limitations of this arterial phase study.  Review of the MIP  images confirms the above findings.  NON-VASCULAR  Lower chest: Emphysematous disease. Stable linear scarring. No consolidation or effusion.  Hepatobiliary: No focal liver abnormality is seen. No gallstones, gallbladder wall thickening, or biliary dilatation.  Pancreas: Unremarkable. No pancreatic ductal dilatation or surrounding inflammatory changes.  Spleen: Normal in size without focal abnormality.  Adrenals/Urinary Tract: Adrenal glands are unremarkable. Kidneys are normal, without renal calculi, focal lesion, or hydronephrosis. Bladder is unremarkable.  Stomach/Bowel: Unremarkable stomach and small bowel. Probable appendectomy. Uncomplicated colonic diverticulosis.  Lymphatic: No adenopathy in the abdomen or pelvis.  Reproductive: Prostatectomy.  Other: No focal inflammation.  No ascites.  Musculoskeletal: No significant skeletal lesion.  IMPRESSION: VASCULAR  1. Unchanged 5.7 cm infrarenal aortic aneurysm. No rupture or dissection. 2. Extensive atherosclerotic calcification. No branch vessel occlusions. 3. Unchanged common iliac artery aneurysms bilaterally.  NON-VASCULAR  1. No acute findings are evident in the abdomen or pelvis. 2. Colonic diverticulosis.   Electronically Signed   By: Andreas Newport M.D.   On: 11/02/2016 05:47  Assessment / Plan: 1. Chronic PACs and PVCs. He is on appropriate dose of metoprolol now. Not symptomatic now.   2. Hypertension- under fair control.  I would continue with current regimen. I don't think his multiple symptoms  are medication related.  3. Hyperlipidemia.  4. AAA.  5.7 cm by CT. Followed by Dr. Donnetta Hutching.  5. S/p CEA on right. Patent  6. Chest pain. Recent myoview study low risk.

## 2016-11-02 ENCOUNTER — Ambulatory Visit: Payer: Medicare Other | Admitting: Cardiology

## 2016-11-02 ENCOUNTER — Emergency Department (HOSPITAL_COMMUNITY): Payer: Medicare Other

## 2016-11-02 ENCOUNTER — Encounter: Payer: Self-pay | Admitting: *Deleted

## 2016-11-02 ENCOUNTER — Encounter (HOSPITAL_COMMUNITY): Payer: Self-pay | Admitting: *Deleted

## 2016-11-02 ENCOUNTER — Emergency Department (HOSPITAL_COMMUNITY)
Admission: EM | Admit: 2016-11-02 | Discharge: 2016-11-02 | Disposition: A | Discharge status: Home / Self Care | Payer: Medicare Other | Attending: Emergency Medicine | Admitting: Emergency Medicine

## 2016-11-02 DIAGNOSIS — R1031 Right lower quadrant pain: Secondary | ICD-10-CM | POA: Diagnosis not present

## 2016-11-02 DIAGNOSIS — Z8546 Personal history of malignant neoplasm of prostate: Secondary | ICD-10-CM | POA: Insufficient documentation

## 2016-11-02 DIAGNOSIS — E785 Hyperlipidemia, unspecified: Secondary | ICD-10-CM | POA: Diagnosis not present

## 2016-11-02 DIAGNOSIS — Z8673 Personal history of transient ischemic attack (TIA), and cerebral infarction without residual deficits: Secondary | ICD-10-CM | POA: Insufficient documentation

## 2016-11-02 DIAGNOSIS — Z87891 Personal history of nicotine dependence: Secondary | ICD-10-CM | POA: Diagnosis not present

## 2016-11-02 DIAGNOSIS — Z79899 Other long term (current) drug therapy: Secondary | ICD-10-CM | POA: Insufficient documentation

## 2016-11-02 DIAGNOSIS — I714 Abdominal aortic aneurysm, without rupture, unspecified: Secondary | ICD-10-CM

## 2016-11-02 DIAGNOSIS — I10 Essential (primary) hypertension: Secondary | ICD-10-CM | POA: Insufficient documentation

## 2016-11-02 DIAGNOSIS — Z7982 Long term (current) use of aspirin: Secondary | ICD-10-CM | POA: Diagnosis not present

## 2016-11-02 LAB — CBC
HCT: 42 % (ref 39.0–52.0)
Hemoglobin: 14.1 g/dL (ref 13.0–17.0)
MCH: 29.6 pg (ref 26.0–34.0)
MCHC: 33.6 g/dL (ref 30.0–36.0)
MCV: 88.1 fL (ref 78.0–100.0)
PLATELETS: 257 10*3/uL (ref 150–400)
RBC: 4.77 MIL/uL (ref 4.22–5.81)
RDW: 12.4 % (ref 11.5–15.5)
WBC: 9.1 10*3/uL (ref 4.0–10.5)

## 2016-11-02 LAB — URINALYSIS, ROUTINE W REFLEX MICROSCOPIC
Bilirubin Urine: NEGATIVE
GLUCOSE, UA: NEGATIVE mg/dL
HGB URINE DIPSTICK: NEGATIVE
KETONES UR: 5 mg/dL — AB
Leukocytes, UA: NEGATIVE
Nitrite: NEGATIVE
PROTEIN: NEGATIVE mg/dL
Specific Gravity, Urine: 1.017 (ref 1.005–1.030)
pH: 5 (ref 5.0–8.0)

## 2016-11-02 LAB — COMPREHENSIVE METABOLIC PANEL
ALK PHOS: 111 U/L (ref 38–126)
ALT: 14 U/L — AB (ref 17–63)
AST: 22 U/L (ref 15–41)
Albumin: 3.7 g/dL (ref 3.5–5.0)
Anion gap: 10 (ref 5–15)
BUN: 23 mg/dL — AB (ref 6–20)
CALCIUM: 9.2 mg/dL (ref 8.9–10.3)
CHLORIDE: 103 mmol/L (ref 101–111)
CO2: 26 mmol/L (ref 22–32)
CREATININE: 1.42 mg/dL — AB (ref 0.61–1.24)
GFR, EST AFRICAN AMERICAN: 50 mL/min — AB (ref >60)
GFR, EST NON AFRICAN AMERICAN: 43 mL/min — AB (ref >60)
Glucose, Bld: 96 mg/dL (ref 65–99)
Potassium: 3.2 mmol/L — ABNORMAL LOW (ref 3.5–5.1)
Sodium: 139 mmol/L (ref 135–145)
Total Bilirubin: 2 mg/dL — ABNORMAL HIGH (ref 0.3–1.2)
Total Protein: 6.2 g/dL — ABNORMAL LOW (ref 6.5–8.1)

## 2016-11-02 MED ORDER — IOPAMIDOL (ISOVUE-370) INJECTION 76%
INTRAVENOUS | Status: AC
  Administered 2016-11-02: 100 mL
  Filled 2016-11-02: qty 100

## 2016-11-02 MED ORDER — SODIUM CHLORIDE 0.9 % IV BOLUS (SEPSIS)
500.0000 mL | Freq: Once | INTRAVENOUS | Status: AC
  Administered 2016-11-02: 500 mL via INTRAVENOUS

## 2016-11-02 NOTE — ED Notes (Signed)
Pt taken to CT.

## 2016-11-02 NOTE — Discharge Instructions (Signed)
You were seen today for abdominal pain. Your abdominal aortic aneurysm is stable. Your workup is otherwise reassuring. Follow-up closely with your primary physician. If you have any new or worsening symptoms including new or worsening abdominal pain you should be reevaluated. Follow-up with vascular surgery as previously scheduled.

## 2016-11-02 NOTE — ED Provider Notes (Signed)
[facility] DEPT Provider Note   CSN: [id number] Arrival date & time: 11/02/16  0243  By signing my name below, I, Margit Banda, attest that this documentation has been prepared under the direction and in the presence of Phoenix Riesen, Barbette Hair, MD. Electronically Signed: Margit Banda, ED Scribe. 11/02/16. 3:05 AM.  History   Chief Complaint Chief Complaint  Patient presents with  . Abdominal Pain    HPI Eric Carpenter is a 83 y.o. male with a PMHx of HTN, AAA who presents to the Emergency Department complaining of gradually worsening RLQ pain that started yesterday, 10/31/16. Pain intermittently radiates to right groin and testicles. Pt currently feels fine. He has had pain like this previously. Reports recent hospitalization with similar pain. He was told to come back to the ED if his pain returned or worsened due to his hx of AAA when he was discharged on 10/23/16 from the hospital. Pt denies nausea, vomiting, diarrhea, urinary sx, and fever.  Of note, patient has a 5.7 cm AAA which was evaluated during recent hospitalization. He has follow-up with vascular surgery.  The history is provided by the patient and medical records. No language interpreter was used.    Past Medical History:  Diagnosis Date  . AAA (abdominal aortic aneurysm) ([place])   . Abdominal aortic aneurysm ([place])    a. 03/2013 Abd Duplex: AAA 4.2 x 4.2 cm. b. 02/2014: CTA no dissection; increase in aneurysm to 5.1cm,  . Carotid artery occlusion    a. s/p R CEA 1998;  b. 03/2013 U/S: RICA < 73%, LICA 71-06%.  . Chest pain 10/21/2016  . GERD (gastroesophageal reflux disease)   . H/O cardiovascular stress test    a. Nuc 02/2014:  EF 79%, inferior infarction with preserved EF versus diaphragmatic attenuation given normal inferior wall motion, no evidence of ischemia.  . Hepatic steatosis    a. Hepatic steatosis with probable early cirrhosis on CT 02/2014.  [name] History of measles, mumps, or rubella   . History of TIAs     Questionable CVA in 1989  . Hyperlipidemia   . Hypertension   . Premature atrial contraction   . Prostate cancer ([place])   . PVC's (premature ventricular contractions)   . PVD (peripheral vascular disease) ([place])    a. CT 02/2014: mod stenosis at origins of SMA and L renal artery.  . Status post carotid endarterectomy     Patient Active Problem List   Diagnosis Date Noted  . Atypical chest pain   . Chest pain 10/21/2016  . Hepatic steatosis 03/05/2014  . PVD (peripheral vascular disease) ([place]) 03/05/2014  . Coronary artery calcification 03/04/2014  . Nevus of iris of left eye 07/11/2013  . Occlusion and stenosis of carotid artery without mention of cerebral infarction 03/23/2012  . Prostate cancer ([place]) 11/23/2011  . Hypertension   . Hyperlipidemia   . PVC's (premature ventricular contractions)   . Premature atrial contraction   . Abdominal aortic aneurysm ([place])   . Status post carotid endarterectomy     Past Surgical History:  Procedure Laterality Date  . APPENDECTOMY    . CAROTID ENDARTERECTOMY Right 1998   cea  . EYE SURGERY  July 2012   Cataract Bilateral eye  . EYE SURGERY  Dec. 18, 2013   Right lower eyelid surgery  . HERNIA REPAIR    . PROSTATE SURGERY    . TONSILLECTOMY    . TOTAL KNEE ARTHROPLASTY     right       Home  Medications    Prior to Admission medications   Medication Sig Start Date End Date Taking? Authorizing Provider  ALPRAZolam (XANAX) 0.25 MG tablet Take 0.25 mg by mouth as needed for sleep.     [provider]  aspirin EC 81 MG tablet Take 81 mg by mouth 2 (two) times a week.    [provider]  atorvastatin (LIPITOR) 40 MG tablet Take 40 mg by mouth daily.      [provider]  hydrochlorothiazide (HYDRODIURIL) 25 MG tablet Take 1 tablet (25 mg total) by mouth daily. 04/26/14   Martinique, Peter M, MD  metoprolol succinate (TOPROL-XL) 50 MG 24 hr tablet Take 1 & 1/2 tablets by mouth daily 01/15/16   Martinique, Peter M,  MD    Family History Family History  Problem Relation Age of Onset  . Stroke Father   . Hypertension Father   . Hyperlipidemia Father   . Heart failure Mother     Social History Social History  Substance Use Topics  . Smoking status: Former Smoker    Packs/day: 1.00    Years: 20.00    Types: Cigarettes    Quit date: 04/14/1979  . Smokeless tobacco: Never Used  . Alcohol use 0.6 oz/week    1 Standard drinks or equivalent per week     Allergies   Ciprofloxacin; Cleocin [clindamycin hcl]; Tobradex [tobramycin-dexamethasone]; Neomycin; and Clindamycin   Review of Systems Review of Systems  Constitutional: Negative for fever.  Respiratory: Negative for shortness of breath.   Cardiovascular: Negative for chest pain.  Gastrointestinal: Positive for abdominal pain. Negative for diarrhea, nausea and vomiting.  Genitourinary: Negative for dysuria and hematuria.  All other systems reviewed and are negative.    Physical Exam Updated Vital Signs BP (!) 99/54   Pulse (!) 56   Temp 98.5 F (36.9 C) (Oral)   Resp 14   SpO2 95%   Physical Exam  Constitutional: He is oriented to person, place, and time. No distress.  Appears younger than stated age, no acute distress  HENT:  Head: Normocephalic and atraumatic.  Cardiovascular: Normal rate, regular rhythm and normal heart sounds.   No murmur heard. Pulmonary/Chest: Effort normal and breath sounds normal. No respiratory distress. He has no wheezes.  Abdominal: Soft. Bowel sounds are normal. He exhibits no mass. There is no tenderness. There is no rebound and no guarding.  Neurological: He is alert and oriented to person, place, and time.  Skin: Skin is warm and dry.  Psychiatric: He has a normal mood and affect.  Nursing note and vitals reviewed.    ED Treatments / Results  DIAGNOSTIC STUDIES: Oxygen Saturation is 95% on RA, adequate by my interpretation.   COORDINATION OF CARE: 3:05 AM-Discussed next steps with pt.  Pt verbalized understanding and is agreeable with the plan.   Labs (all labs ordered are listed, but only abnormal results are displayed) Labs Reviewed  COMPREHENSIVE METABOLIC PANEL - Abnormal; Notable for the following:       Result Value   Potassium 3.2 (*)    BUN 23 (*)    Creatinine, Ser 1.42 (*)    Total Protein 6.2 (*)    ALT 14 (*)    Total Bilirubin 2.0 (*)    GFR calc non Af Amer 43 (*)    GFR calc Af Amer 50 (*)    All other components within normal limits  URINALYSIS, ROUTINE W REFLEX MICROSCOPIC - Abnormal; Notable for the following:    Ketones,  ur 5 (*)    All other components within normal limits  CBC    EKG  EKG Interpretation None       Radiology Ct Angio Abd/pel W And/or Wo Contrast  Result Date: 11/02/2016 CLINICAL DATA:  Right lower quadrant pain, onset tonight, radiating to the groin. EXAM: CTA ABDOMEN AND PELVIS wITHOUT AND WITH CONTRAST TECHNIQUE: Multidetector CT imaging of the abdomen and pelvis was performed using the standard protocol during bolus administration of intravenous contrast. Multiplanar reconstructed images and MIPs were obtained and reviewed to evaluate the vascular anatomy. CONTRAST:  80 mL Isovue 370 intravenous COMPARISON:  10/23/2016 FINDINGS: VASCULAR Aorta: Extensive calcified plaque. Infrarenal abdominal aortic aneurysm is unchanged from 10/23/2016, 5.7 cm maximum diameter. No dissection. No rupture. No retroperitoneal hemorrhage. Celiac: Calcified plaque at the origin. Patent without evidence of aneurysm, dissection, vasculitis or significant stenosis. SMA: Calcified plaque at the origin. Patent without evidence of aneurysm, dissection, vasculitis or significant stenosis. Renals: Calcified plaque at the origins. Accessory superior pole right renal artery. All renal arteries are patent without evidence of aneurysm, dissection, vasculitis, fibromuscular dysplasia or significant stenosis. IMA: Patent without evidence of aneurysm,  dissection, vasculitis or significant stenosis. Inflow: Unchanged aneurysmal enlargement of both common iliac arteries, approximately 2 cm each. No dissection or rupture. Proximal Outflow: Extensive calcified plaque without stenosis or dissection. Mild aneurysmal enlargement at the distal common femoral artery bilaterally, 1.4 cm on the right and 1.3 cm on the left. Veins: No obvious venous abnormality within the limitations of this arterial phase study. Review of the MIP images confirms the above findings. NON-VASCULAR Lower chest: Emphysematous disease. Stable linear scarring. No consolidation or effusion. Hepatobiliary: No focal liver abnormality is seen. No gallstones, gallbladder wall thickening, or biliary dilatation. Pancreas: Unremarkable. No pancreatic ductal dilatation or surrounding inflammatory changes. Spleen: Normal in size without focal abnormality. Adrenals/Urinary Tract: Adrenal glands are unremarkable. Kidneys are normal, without renal calculi, focal lesion, or hydronephrosis. Bladder is unremarkable. Stomach/Bowel: Unremarkable stomach and small bowel. Probable appendectomy. Uncomplicated colonic diverticulosis. Lymphatic: No adenopathy in the abdomen or pelvis. Reproductive: Prostatectomy. Other: No focal inflammation.  No ascites. Musculoskeletal: No significant skeletal lesion. IMPRESSION: VASCULAR 1. Unchanged 5.7 cm infrarenal aortic aneurysm. No rupture or dissection. 2. Extensive atherosclerotic calcification. No branch vessel occlusions. 3. Unchanged common iliac artery aneurysms bilaterally. NON-VASCULAR 1. No acute findings are evident in the abdomen or pelvis. 2. Colonic diverticulosis. Electronically Signed   By: Andreas Newport M.D.   On: 11/02/2016 05:47    Procedures Procedures (including critical care time)  Medications Ordered in ED Medications  sodium chloride 0.9 % bolus 500 mL (0 mLs Intravenous Stopped 11/02/16 0547)  iopamidol (ISOVUE-370) 76 % injection (100 mLs   Contrast Given 11/02/16 0429)     Initial Impression / Assessment and Plan / ED Course  I have reviewed the triage vital signs and the nursing notes.  Pertinent labs & imaging results that were available during my care of the patient were reviewed by me and considered in my medical decision making (see chart for details).     Patient presents with lower abdominal pain. He is concerned because he was told to return if he has pain like this. Currently he is pain-free. He is nontoxic. Vital signs reassuring. Blood pressure 865-784 systolic. His exam is benign. No reproducible tenderness or mass on exam. Workup is notable for mild a With a creatinine of 1.42. Otherwise no leukocytosis. Differential includes leaking AAA (however, clinically he is very well-appearing), kidney stone,  appendicitis. I discussed with patient the risk and benefits of repeat imaging. He does have some mild acute kidney injury but GFR would allow for scanning. Patient is very anxious as he was "told to come back if he had any pain." I have reassured him that I feel his exam is reassuring soon: However, patient would like to confirm no worsening of his AAA. He was given 500 mL of fluid and CT scan was obtained. It shows a stable AAA. No other pathology noted. Patient reassured. Follow-up with primary physician and vascular surgery as planned.  After history, exam, and medical workup I feel the patient has been appropriately medically screened and is safe for discharge home. Pertinent diagnoses were discussed with the patient. Patient was given return precautions.   Final Clinical Impressions(s) / ED Diagnoses   Final diagnoses:  Right lower quadrant abdominal pain  AAA (abdominal aortic aneurysm) without rupture ([facility])    New Prescriptions New Prescriptions   No medications on file   I personally performed the services described in this documentation, which was scribed in my presence. The recorded information has been  reviewed and is accurate.     Merryl Hacker, MD 11/02/16 [phone]

## 2016-11-02 NOTE — ED Triage Notes (Addendum)
Pt c/o RLQ pain onset tonight; pain radiates into R groin and testicles at times.  [facility] reports initial oxygen sats at 89% after pt walked to end of his driveway to be picked up by the ambulance. Pt reports hx of AAA and was told to be evaluated if ever having abd pain. Pt denies pain at present

## 2016-11-06 IMAGING — RF DG SWALLOWING FUNCTION
1 series · 14 of 24 positions shown · non-contrast
Comparison: Chest CTA 03/04/2014.

CLINICAL DATA: 87-year-old male with frequent choking during meals.
Symptoms progressing. Initial encounter.

EXAM:
MODIFIED BARIUM SWALLOW
TECHNIQUE: Different consistencies of barium were administered orally to the
patient by the Speech Pathologist. Imaging of the pharynx was
performed in the lateral projection.
FLUOROSCOPY TIME:  1 min 0 seconds

[Series 1: run · 14 acquisitions, 14 frames shown]
[im 1/14]
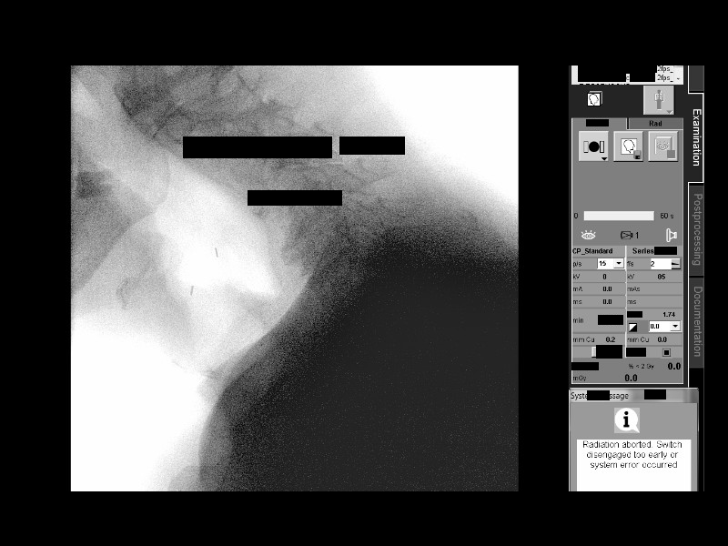
[im 2/14]
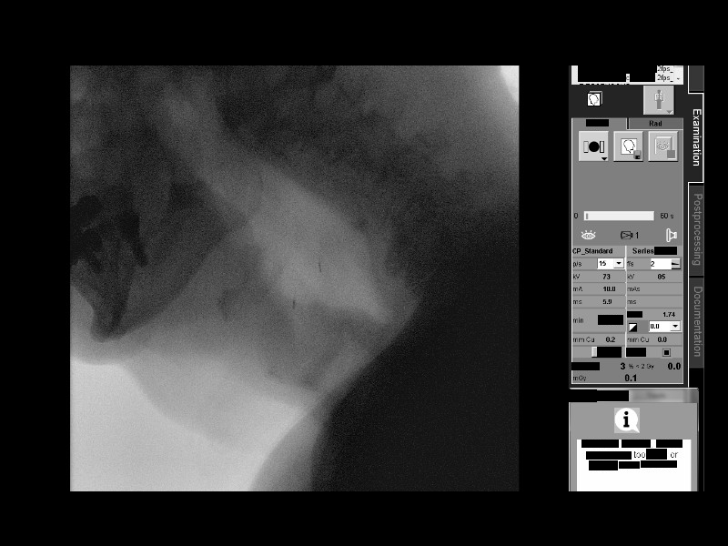
[im 3/14]
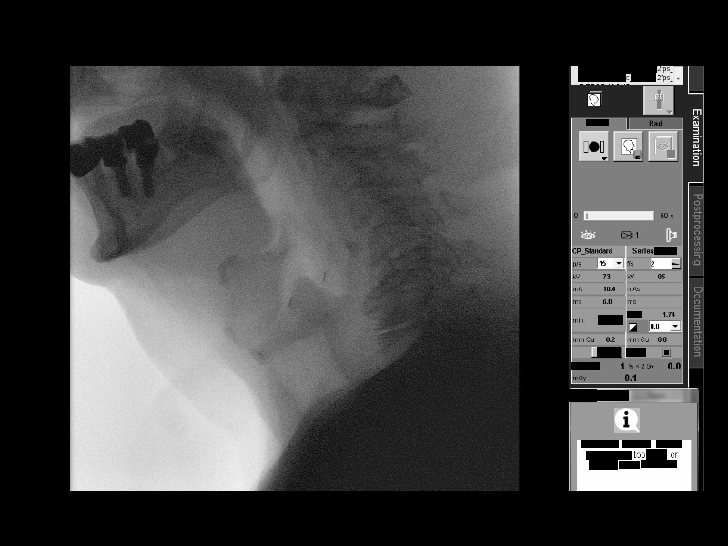
[im 4/14]
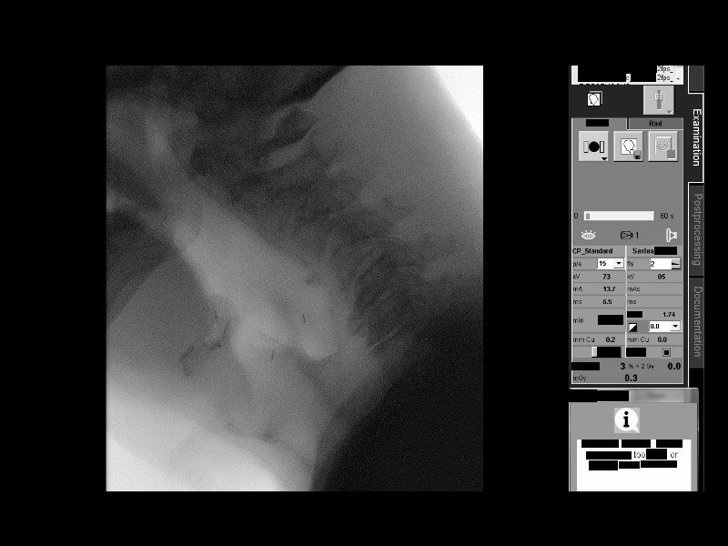
[im 5/14]
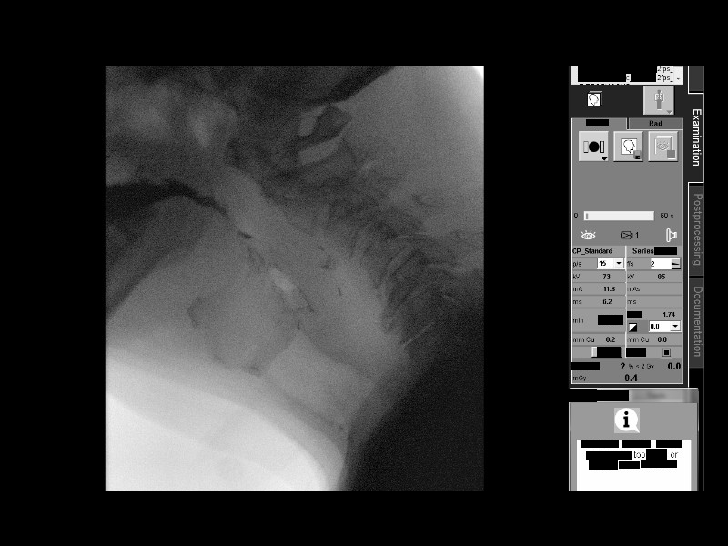
[im 6/14]
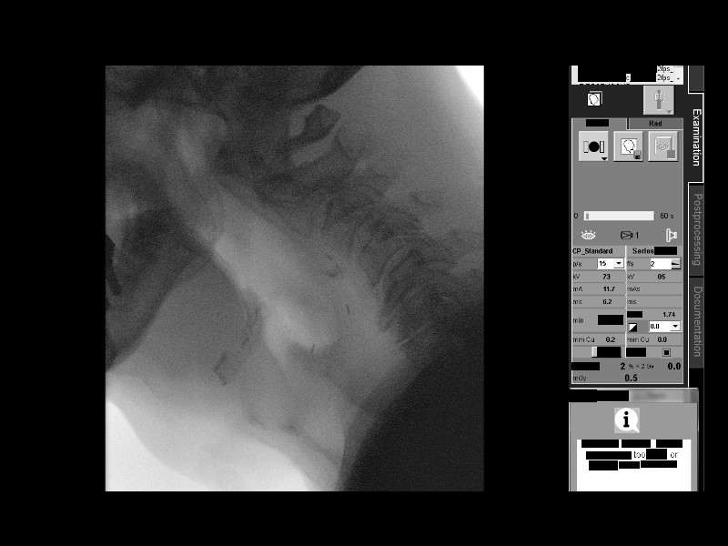
[im 7/14]
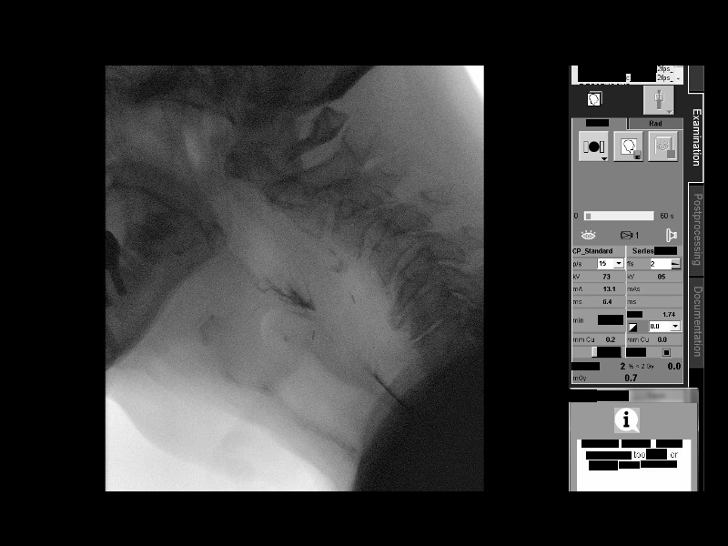
[im 8/14]
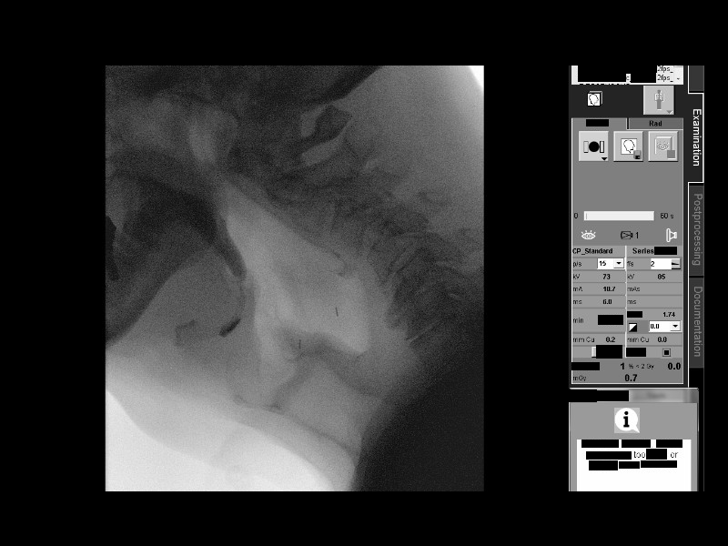
[im 9/14]
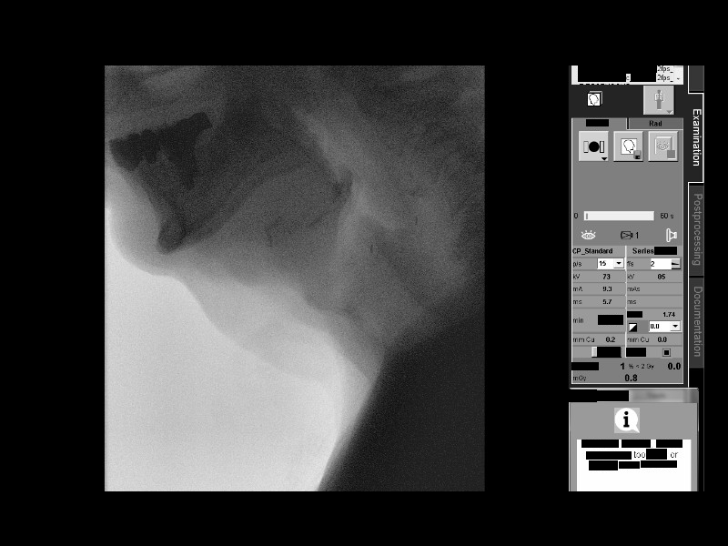
[im 10/14]
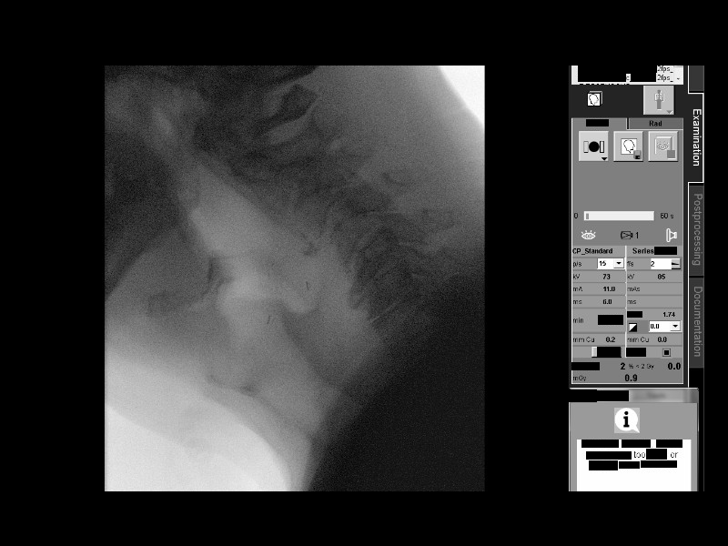
[im 11/14]
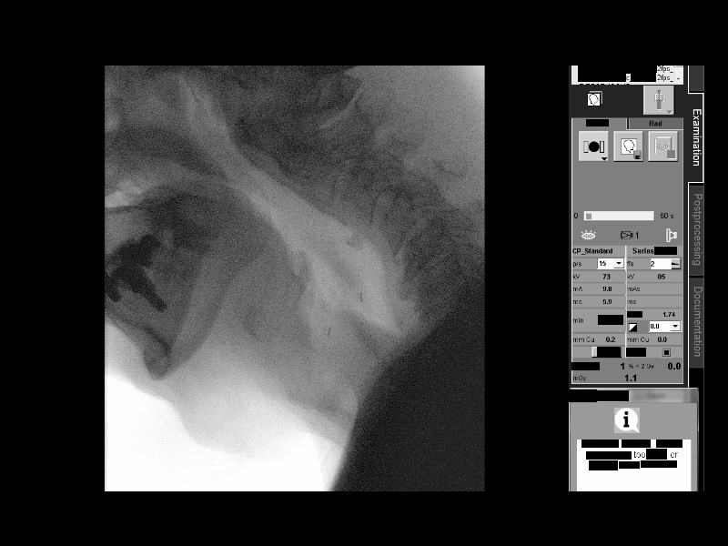
[im 12/14]
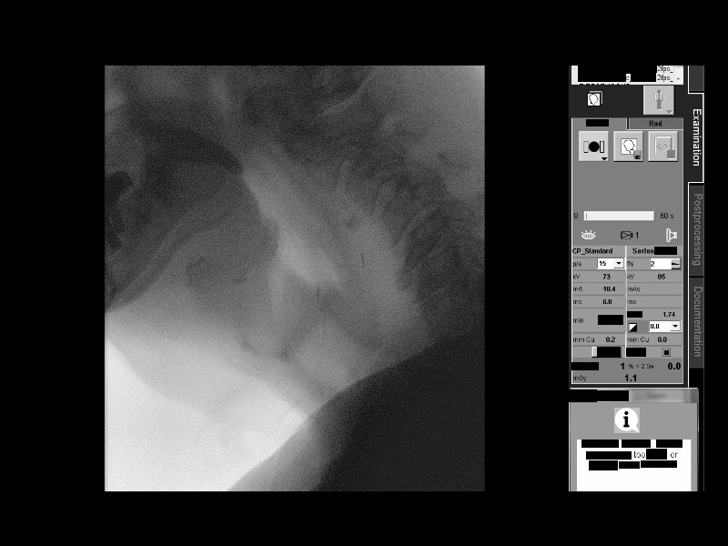
[im 13/14]
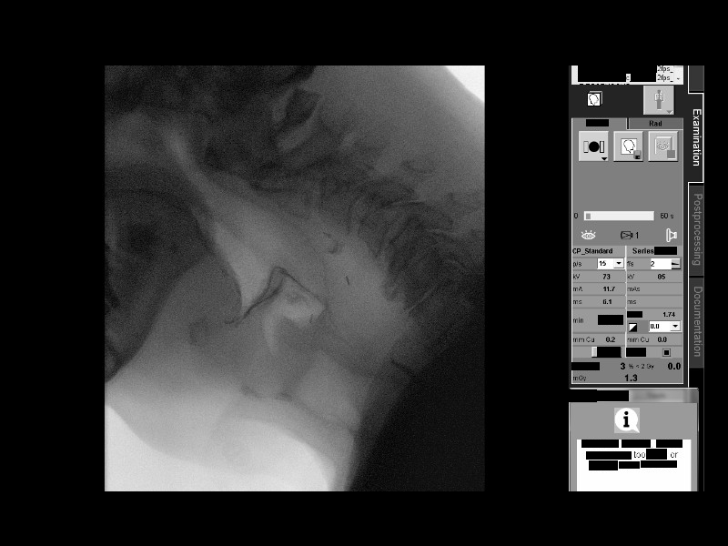
[im 14/14]
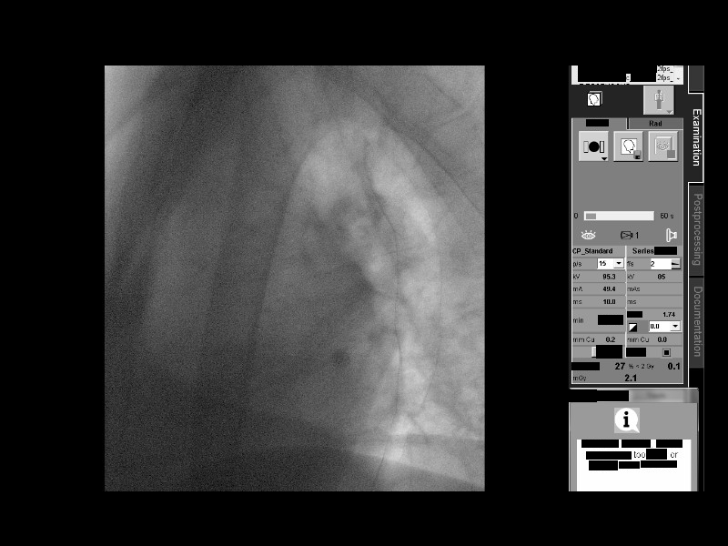

[14 of 24 positions shown; findings below may reference images not displayed]

FINDINGS: Thin liquid- Mild retention.  Flash penetration.  No aspiration.

Nectar thick liquid- not administered

Honey- not administered

Sentol?Delores within normal limits

Cracker-within normal limits

Sentol?Thad with cracker- not administered

Barium tablet - administered with thin liquid, and within normal
limits.
IMPRESSION: Mild retention and flash penetration with thin liquids.

Please refer to the Speech Pathologists report for complete details
and recommendations.

## 2016-11-17 ENCOUNTER — Encounter (HOSPITAL_COMMUNITY): Payer: Self-pay | Admitting: *Deleted

## 2016-11-17 ENCOUNTER — Emergency Department (HOSPITAL_COMMUNITY)
Admission: EM | Admit: 2016-11-17 | Discharge: 2016-11-17 | Disposition: A | Discharge status: Home / Self Care | Payer: Medicare Other | Attending: Emergency Medicine | Admitting: Emergency Medicine

## 2016-11-17 ENCOUNTER — Emergency Department (HOSPITAL_COMMUNITY): Payer: Medicare Other

## 2016-11-17 ENCOUNTER — Inpatient Hospital Stay (HOSPITAL_COMMUNITY): Admit: 2016-11-17 | Payer: Medicare Other

## 2016-11-17 DIAGNOSIS — I10 Essential (primary) hypertension: Secondary | ICD-10-CM | POA: Insufficient documentation

## 2016-11-17 DIAGNOSIS — Z96651 Presence of right artificial knee joint: Secondary | ICD-10-CM | POA: Insufficient documentation

## 2016-11-17 DIAGNOSIS — M549 Dorsalgia, unspecified: Secondary | ICD-10-CM | POA: Diagnosis present

## 2016-11-17 DIAGNOSIS — Z79899 Other long term (current) drug therapy: Secondary | ICD-10-CM | POA: Diagnosis not present

## 2016-11-17 DIAGNOSIS — Z8546 Personal history of malignant neoplasm of prostate: Secondary | ICD-10-CM | POA: Insufficient documentation

## 2016-11-17 DIAGNOSIS — Z87891 Personal history of nicotine dependence: Secondary | ICD-10-CM | POA: Diagnosis not present

## 2016-11-17 DIAGNOSIS — I714 Abdominal aortic aneurysm, without rupture, unspecified: Secondary | ICD-10-CM

## 2016-11-17 DIAGNOSIS — Z7982 Long term (current) use of aspirin: Secondary | ICD-10-CM | POA: Diagnosis not present

## 2016-11-17 DIAGNOSIS — R1030 Lower abdominal pain, unspecified: Secondary | ICD-10-CM

## 2016-11-17 LAB — COMPREHENSIVE METABOLIC PANEL
ALBUMIN: 3.7 g/dL (ref 3.5–5.0)
ALK PHOS: 105 U/L (ref 38–126)
ALT: 13 U/L — ABNORMAL LOW (ref 17–63)
ANION GAP: 10 (ref 5–15)
AST: 22 U/L (ref 15–41)
BILIRUBIN TOTAL: 1.4 mg/dL — AB (ref 0.3–1.2)
BUN: 18 mg/dL (ref 6–20)
CALCIUM: 9 mg/dL (ref 8.9–10.3)
CO2: 28 mmol/L (ref 22–32)
CREATININE: 1.02 mg/dL (ref 0.61–1.24)
Chloride: 101 mmol/L (ref 101–111)
GFR calc Af Amer: >60 mL/min (ref >60)
GFR calc non Af Amer: >60 mL/min (ref >60)
GLUCOSE: 118 mg/dL — AB (ref 65–99)
Potassium: 3.5 mmol/L (ref 3.5–5.1)
Sodium: 139 mmol/L (ref 135–145)
TOTAL PROTEIN: 7 g/dL (ref 6.5–8.1)

## 2016-11-17 LAB — CBC WITH DIFFERENTIAL/PLATELET
BASOS ABS: 0 10*3/uL (ref 0.0–0.1)
BASOS PCT: 0 %
EOS ABS: 0 10*3/uL (ref 0.0–0.7)
Eosinophils Relative: 0 %
HCT: 43.3 % (ref 39.0–52.0)
HEMOGLOBIN: 14.6 g/dL (ref 13.0–17.0)
Lymphocytes Relative: 10 %
Lymphs Abs: 1.3 10*3/uL (ref 0.7–4.0)
MCH: 30 pg (ref 26.0–34.0)
MCHC: 33.7 g/dL (ref 30.0–36.0)
MCV: 88.9 fL (ref 78.0–100.0)
MONOS PCT: 18 %
Monocytes Absolute: 2.3 10*3/uL — ABNORMAL HIGH (ref 0.1–1.0)
NEUTROS ABS: 9.5 10*3/uL — AB (ref 1.7–7.7)
NEUTROS PCT: 72 %
Platelets: 241 10*3/uL (ref 150–400)
RBC: 4.87 MIL/uL (ref 4.22–5.81)
RDW: 12.6 % (ref 11.5–15.5)
WBC: 13.2 10*3/uL — AB (ref 4.0–10.5)

## 2016-11-17 LAB — URINALYSIS, ROUTINE W REFLEX MICROSCOPIC
BACTERIA UA: NONE SEEN
BILIRUBIN URINE: NEGATIVE
Glucose, UA: NEGATIVE mg/dL
Ketones, ur: NEGATIVE mg/dL
Leukocytes, UA: NEGATIVE
NITRITE: NEGATIVE
PH: 7 (ref 5.0–8.0)
Protein, ur: NEGATIVE mg/dL
SPECIFIC GRAVITY, URINE: 1.041 — AB (ref 1.005–1.030)
SQUAMOUS EPITHELIAL / LPF: NONE SEEN

## 2016-11-17 LAB — LIPASE, BLOOD: Lipase: 26 U/L (ref 11–51)

## 2016-11-17 MED ORDER — IOPAMIDOL (ISOVUE-370) INJECTION 76%
INTRAVENOUS | Status: AC
  Filled 2016-11-17: qty 100

## 2016-11-17 MED ORDER — FENTANYL CITRATE (PF) 100 MCG/2ML IJ SOLN
50.0000 ug | Freq: Once | INTRAMUSCULAR | Status: AC
  Administered 2016-11-17: 50 ug via INTRAVENOUS
  Filled 2016-11-17: qty 2

## 2016-11-17 MED ORDER — SODIUM CHLORIDE 0.9 % IV BOLUS (SEPSIS)
500.0000 mL | Freq: Once | INTRAVENOUS | Status: AC
  Administered 2016-11-17: 500 mL via INTRAVENOUS

## 2016-11-17 MED ORDER — TRAMADOL HCL 50 MG PO TABS: 50.0000 mg | ORAL_TABLET | Freq: Four times a day (QID) | ORAL | 0 refills | Status: AC | PRN

## 2016-11-17 MED ORDER — IOPAMIDOL (ISOVUE-370) INJECTION 76%
100.0000 mL | Freq: Once | INTRAVENOUS | Status: AC | PRN
  Administered 2016-11-17: 100 mL via INTRAVENOUS

## 2016-11-17 NOTE — ED Provider Notes (Signed)
[facility] DEPT Provider Note   CSN: [id number] Arrival date & time: 11/17/16  0850     History   Chief Complaint Chief Complaint  Patient presents with  . Back Pain    HPI Eric Carpenter is a 83 y.o. male.  HPI Patient with known AAA who was evaluated in the emergency department on 7/23 with CT angio abdomen showing no change in the aneurysm. Patient states that he was watching TV yesterday and developed low back pain that radiated around to his lower abdomen. States the pain is worse with movement. Denies any trauma. No numbness or weakness in the lower extremities. Normal bowel movement yesterday. No blood or mucus in the stool. No fever or chills. No nausea or vomiting. Denies any incontinence or dysuria. Past Medical History:  Diagnosis Date  . AAA (abdominal aortic aneurysm) ([place])   . Abdominal aortic aneurysm ([place])    a. 03/2013 Abd Duplex: AAA 4.2 x 4.2 cm. b. 02/2014: CTA no dissection; increase in aneurysm to 5.1cm,  . Carotid artery occlusion    a. s/p R CEA 1998;  b. 03/2013 U/S: RICA < 81%, LICA 01-75%.  . Chest pain 10/21/2016  . GERD (gastroesophageal reflux disease)   . H/O cardiovascular stress test    a. Nuc 02/2014:  EF 79%, inferior infarction with preserved EF versus diaphragmatic attenuation given normal inferior wall motion, no evidence of ischemia.  . Hepatic steatosis    a. Hepatic steatosis with probable early cirrhosis on CT 02/2014.  [name] History of measles, mumps, or rubella   . History of TIAs    Questionable CVA in 1989  . Hyperlipidemia   . Hypertension   . Premature atrial contraction   . Prostate cancer ([place])   . PVC's (premature ventricular contractions)   . PVD (peripheral vascular disease) ([place])    a. CT 02/2014: mod stenosis at origins of SMA and L renal artery.  . Status post carotid endarterectomy     Patient Active Problem List   Diagnosis Date Noted  . Atypical chest pain   . Chest pain 10/21/2016  . Hepatic steatosis  03/05/2014  . PVD (peripheral vascular disease) ([place]) 03/05/2014  . Coronary artery calcification 03/04/2014  . Nevus of iris of left eye 07/11/2013  . Occlusion and stenosis of carotid artery without mention of cerebral infarction 03/23/2012  . Prostate cancer ([place]) 11/23/2011  . Hypertension   . Hyperlipidemia   . PVC's (premature ventricular contractions)   . Premature atrial contraction   . Abdominal aortic aneurysm ([place])   . Status post carotid endarterectomy     Past Surgical History:  Procedure Laterality Date  . APPENDECTOMY    . CAROTID ENDARTERECTOMY Right 1998   cea  . EYE SURGERY  July 2012   Cataract Bilateral eye  . EYE SURGERY  Dec. 18, 2013   Right lower eyelid surgery  . HERNIA REPAIR    . PROSTATE SURGERY    . TONSILLECTOMY    . TOTAL KNEE ARTHROPLASTY     right       Home Medications    Prior to Admission medications   Medication Sig Start Date End Date Taking? Authorizing Provider  ALPRAZolam (XANAX) 0.25 MG tablet Take 0.125 mg by mouth at bedtime as needed for sleep.    Yes [provider]  aspirin EC 81 MG tablet Take 81 mg by mouth 2 (two) times a week. Takes on Monday and Thursday   Yes [provider]  atorvastatin (LIPITOR)  40 MG tablet Take 40 mg by mouth daily.     Yes [provider]  hydrochlorothiazide (HYDRODIURIL) 25 MG tablet Take 1 tablet (25 mg total) by mouth daily. 04/26/14  Yes Martinique, Peter M, MD  metoprolol succinate (TOPROL-XL) 50 MG 24 hr tablet Take 1 & 1/2 tablets by mouth daily Patient taking differently: Take 75 mg by mouth daily.  01/15/16  Yes Martinique, Peter M, MD  Multiple Vitamin (MULTIVITAMIN WITH MINERALS) TABS tablet Take 1 tablet by mouth daily.   Yes [provider]  traMADol (ULTRAM) 50 MG tablet Take 1 tablet (50 mg total) by mouth every 6 (six) hours as needed. 11/17/16   Julianne Rice, MD    Family History Family History  Problem Relation Age of Onset  . Stroke Father   .  Hypertension Father   . Hyperlipidemia Father   . Heart failure Mother     Social History Social History  Substance Use Topics  . Smoking status: Former Smoker    Packs/day: 1.00    Years: 20.00    Types: Cigarettes    Quit date: 04/14/1979  . Smokeless tobacco: Never Used  . Alcohol use 0.6 oz/week    1 Standard drinks or equivalent per week     Allergies   Ciprofloxacin; Cleocin [clindamycin hcl]; Tobradex [tobramycin-dexamethasone]; Neomycin; and Clindamycin   Review of Systems Review of Systems  Constitutional: Negative for chills and fever.  Respiratory: Negative for cough and shortness of breath.   Cardiovascular: Negative for chest pain, palpitations and leg swelling.  Gastrointestinal: Positive for abdominal pain. Negative for blood in stool, constipation, diarrhea, nausea and vomiting.  Genitourinary: Negative for dysuria, flank pain, frequency, hematuria, penile pain, penile swelling, scrotal swelling and testicular pain.  Musculoskeletal: Positive for back pain. Negative for arthralgias, myalgias, neck pain and neck stiffness.  Skin: Negative for rash and wound.  Neurological: Negative for dizziness, weakness, light-headedness, numbness and headaches.  All other systems reviewed and are negative.    Physical Exam Updated Vital Signs BP 128/80 (BP Location: Right Arm)   Pulse 75   Temp 98.3 F (36.8 C) (Oral)   Resp 18   SpO2 91%   Physical Exam  Constitutional: He is oriented to person, place, and time. He appears well-developed and well-nourished.  Very anxious appearing  HENT:  Head: Normocephalic and atraumatic.  Mouth/Throat: Oropharynx is clear and moist.  Eyes: Pupils are equal, round, and reactive to light. EOM are normal.  Neck: Normal range of motion. Neck supple.  Cardiovascular: Normal rate and regular rhythm.  Exam reveals no gallop and no friction rub.   No murmur heard. Pulmonary/Chest: Effort normal and breath sounds normal. No  respiratory distress. He has no wheezes. He has no rales. He exhibits no tenderness.  Abdominal: Soft. Bowel sounds are normal. He exhibits no distension and no mass. There is tenderness (patient withleft lower quadrant tenderness and voluntary guarding. No rebound.). There is no rebound and no guarding.  Musculoskeletal: Normal range of motion. He exhibits no edema or tenderness.  No midline thoracic or lumbar tenderness. No CVA tenderness. 2+ distal pulses in bilateral lower extremities. No lower sugary swelling, asymmetry or tenderness.  Neurological: He is alert and oriented to person, place, and time.  5/5 motor in all extremities. Sensation is fully intact. No saddle anesthesia.  Skin: Skin is warm and dry. Capillary refill takes less than 2 seconds. No rash noted. He is not diaphoretic. No erythema.  Psychiatric: His behavior is normal.  Nursing note and vitals reviewed.    ED Treatments / Results  Labs (all labs ordered are listed, but only abnormal results are displayed) Labs Reviewed  CBC WITH DIFFERENTIAL/PLATELET - Abnormal; Notable for the following:       Result Value   WBC 13.2 (*)    Neutro Abs 9.5 (*)    Monocytes Absolute 2.3 (*)    All other components within normal limits  COMPREHENSIVE METABOLIC PANEL - Abnormal; Notable for the following:    Glucose, Bld 118 (*)    ALT 13 (*)    Total Bilirubin 1.4 (*)    All other components within normal limits  URINALYSIS, ROUTINE W REFLEX MICROSCOPIC - Abnormal; Notable for the following:    Specific Gravity, Urine 1.041 (*)    Hgb urine dipstick SMALL (*)    All other components within normal limits  LIPASE, BLOOD    EKG  EKG Interpretation None       Radiology No results found.  Procedures Procedures (including critical care time)  Medications Ordered in ED Medications  fentaNYL (SUBLIMAZE) injection 50 mcg (50 mcg Intravenous Given 11/17/16 1034)  sodium chloride 0.9 % bolus 500 mL (0 mLs Intravenous  Stopped 11/17/16 1133)  iopamidol (ISOVUE-370) 76 % injection 100 mL (100 mLs Intravenous Contrast Given 11/17/16 1250)     Initial Impression / Assessment and Plan / ED Course  I have reviewed the triage vital signs and the nursing notes.  Pertinent labs & imaging results that were available during my care of the patient were reviewed by me and considered in my medical decision making (see chart for details).     No evidence of AAA rupture. Patient states his pain has significantly improved. Advised to follow-up closely with his vascular surgeon and primary physician. Return precautions given.  Final Clinical Impressions(s) / ED Diagnoses   Final diagnoses:  AAA (abdominal aortic aneurysm) without rupture ([place])  Lower abdominal pain    New Prescriptions Discharge Medication List as of 11/17/2016  3:03 PM    START taking these medications   Details  traMADol (ULTRAM) 50 MG tablet Take 1 tablet (50 mg total) by mouth every 6 (six) hours as needed., Starting Tue 11/17/2016, Print         Julianne Rice, MD 11/20/16 [phone]

## 2016-11-17 NOTE — ED Notes (Signed)
CT call and make aware of patient willing to do his ct scan now.

## 2016-11-17 NOTE — ED Notes (Signed)
Patient lives in [place] and have no mean of transport back home. [facility] will not be able to take him since he is ambulatory. Social worker call and left message to call me.

## 2016-11-17 NOTE — ED Notes (Signed)
Patient transported to CT 

## 2016-11-17 NOTE — Progress Notes (Signed)
CSW spoke with patient regarding transportation concerns. [facility] was able to transport patient however he did not have keys to his apartment and refused to let CSW contact family/friends. Patient stated he would rather pay for a cab on his own. Cab back to [place] is $54- patient agreeable to pay this.   Kingsley Spittle, [facility] Emergency Room Clinical Social Worker [phone]

## 2016-11-17 NOTE — Discharge Instructions (Signed)
No evidence of intra-abdominal infection or worrisome changes to your aneurysm on the CT scan. Follow-up with your primary doctor in your vascular surgeon. Take pain medication as prescribed. You may also take Tylenol as needed.

## 2016-11-17 NOTE — ED Notes (Signed)
Bed: YY48 Expected date:  Expected time:  Means of arrival:  Comments: EMS 80s, back pain

## 2016-11-18 DIAGNOSIS — D72829 Elevated white blood cell count, unspecified: Secondary | ICD-10-CM | POA: Diagnosis not present

## 2016-11-18 DIAGNOSIS — I714 Abdominal aortic aneurysm, without rupture: Secondary | ICD-10-CM

## 2016-11-18 DIAGNOSIS — I10 Essential (primary) hypertension: Secondary | ICD-10-CM

## 2016-11-18 DIAGNOSIS — M545 Low back pain: Secondary | ICD-10-CM

## 2016-11-19 DIAGNOSIS — I10 Essential (primary) hypertension: Secondary | ICD-10-CM | POA: Diagnosis not present

## 2016-11-19 DIAGNOSIS — I714 Abdominal aortic aneurysm, without rupture: Secondary | ICD-10-CM | POA: Diagnosis not present

## 2016-11-19 DIAGNOSIS — D72829 Elevated white blood cell count, unspecified: Secondary | ICD-10-CM | POA: Diagnosis not present

## 2016-11-19 DIAGNOSIS — M545 Low back pain: Secondary | ICD-10-CM | POA: Diagnosis not present

## 2016-11-20 DIAGNOSIS — I10 Essential (primary) hypertension: Secondary | ICD-10-CM | POA: Diagnosis not present

## 2016-11-20 DIAGNOSIS — D72829 Elevated white blood cell count, unspecified: Secondary | ICD-10-CM | POA: Diagnosis not present

## 2016-11-20 DIAGNOSIS — M545 Low back pain: Secondary | ICD-10-CM | POA: Diagnosis not present

## 2016-11-20 DIAGNOSIS — I714 Abdominal aortic aneurysm, without rupture: Secondary | ICD-10-CM | POA: Diagnosis not present

## 2016-11-21 DIAGNOSIS — I714 Abdominal aortic aneurysm, without rupture: Secondary | ICD-10-CM | POA: Diagnosis not present

## 2016-11-21 DIAGNOSIS — I10 Essential (primary) hypertension: Secondary | ICD-10-CM | POA: Diagnosis not present

## 2016-11-21 DIAGNOSIS — D72829 Elevated white blood cell count, unspecified: Secondary | ICD-10-CM | POA: Diagnosis not present

## 2016-11-21 DIAGNOSIS — M545 Low back pain: Secondary | ICD-10-CM | POA: Diagnosis not present

## 2016-11-25 ENCOUNTER — Telehealth: Payer: Self-pay | Admitting: Vascular Surgery

## 2016-11-25 NOTE — Telephone Encounter (Signed)
Returned the phone call to [facility] where patient resides to relay the message from our triage nurse that the patient does not need to be seen due to his high risk of surgical intervention. She voiced understanding and indicated she would keep his regularly scheduled appt for 04/2017. awt

## 2016-11-25 NOTE — Telephone Encounter (Signed)
-----   Message from Mena Goes, RN sent at 11/25/2016 11:18 AM EDT ----- Regarding: RE: Appt Question Contact: [phone] No, see this note, he isn't candidate for EVAR and he is too high a surgical risk.  Progress Notes Date of Service: 10/23/2016 2:16 PM Early, Arvilla Meres, MD  Vascular Surgery     Patient ID: Eric Carpenter, male   DOB: 11-Oct-1926, 83 y.o.   MRN: [id number] Remains comfortable. Reports occasionally having pain at the level of his left nipple. This is not exertional. No abdominal pain.  Discussed the results of his CT scan of his abdomen and pelvis. This reveals maximal diameter of his aorta 5.7 cm which compares to 5.1 cm in 2015. Explained this is relatively slow growth. CT scan shows that he would be a poor candidate for endovascular treatment and I would require open aneurysm repair. Certainly feel that his surgical risk and morbidity far out ways his potential for aneurysm rupture. Explained that his estimated annual rupture rate would be approximately 5%. Recommend continued surveillance in our office. We'll see him again in 6 months with ultrasound of his aorta. He is comfortable with this discussion. Will not follow actively. Please call if we can assist  ED to Hosp-Admission (Discharged) on 10/21/2016   ----- Message ----- From: Lujean Amel Sent: 11/25/2016  10:24 AM To: Mena Goes, RN, [facility] Clinical Pool Subject: Appt Question                                  This patient was seen at [facility] and discharged on 11/17/16 with instructions to follow up w/ Dr.Early. Should I just schedule a next available follow up appt. With Dr.Early? Please advise. Thanks, Anne Ng

## 2017-02-09 ENCOUNTER — Ambulatory Visit: Payer: Medicare Other | Admitting: Vascular Surgery

## 2017-04-27 ENCOUNTER — Other Ambulatory Visit (HOSPITAL_COMMUNITY): Payer: Medicare Other

## 2017-04-27 ENCOUNTER — Ambulatory Visit: Payer: Medicare Other | Admitting: Vascular Surgery

## 2018-11-09 ENCOUNTER — Other Ambulatory Visit: Payer: Self-pay

## 2019-05-29 IMAGING — CT CT CTA ABD/PEL W/CM AND/OR W/O CM
2 of 8 series · 14 of 46 positions shown, 16 images · IV contrast (OMNI)
Comparison: 03/04/2014

CLINICAL DATA: AAA. Pt denies abd pain or complaints. Pt has
chronic low back pain. on 08/08/2016 Duplex of his aorta shows a
continued slow increase in size now up to 5.3 cm with a maximal
diameter in September 2015 predicted at 4.8 cm. He presented to the ED
with CP. He also did not have any associated factors like dyspnea or
diaphoresis. no complaints of abdominal pain or new lumbar pain at
this time. a right hip that radiates to his scapula last for about
30 seconds and then goes away. He states this has been happening for
many years.

EXAM:
CTA ABDOMEN AND PELVIS WITH CONTRAST
TECHNIQUE: Multidetector CT imaging of the abdomen and pelvis was performed
using the standard protocol during bolus administration of
intravenous contrast. Multiplanar reconstructed images and MIPs were
obtained and reviewed to evaluate the vascular anatomy.
CONTRAST:  100 ml iso 370.

[Series 5: dissection 3.0 i30f 3 · axial · 0.75mm/px · z∈[+950,+1406]mm · 11 of 169 slices shown, 13 images]
[im 9/169  soft-tissue]
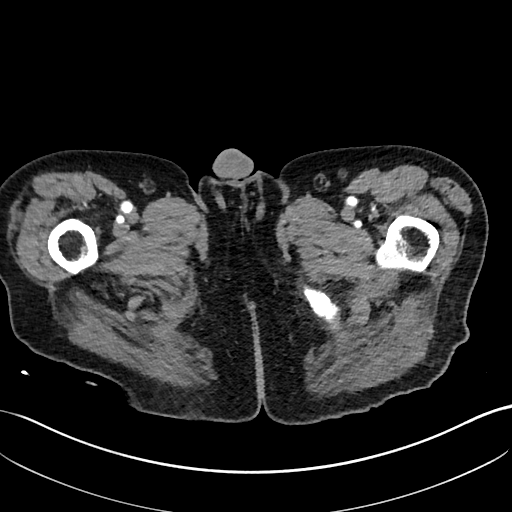
[im 9/169  bone]
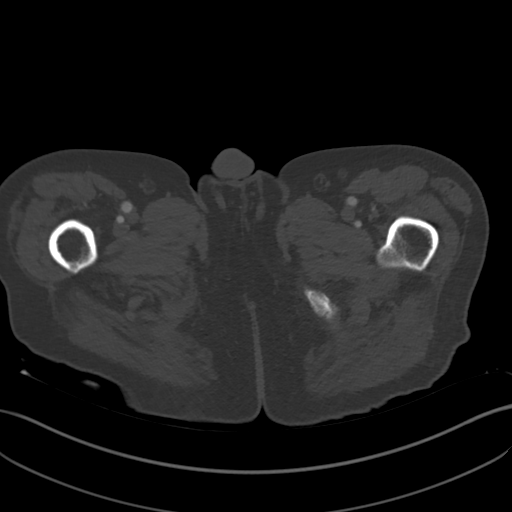
[im 25/169  soft-tissue]
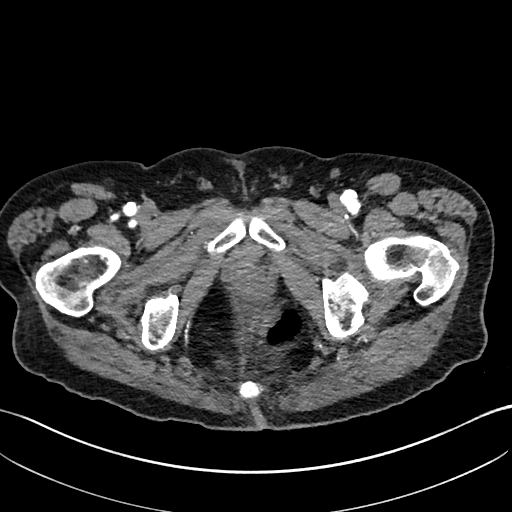
[im 41/169  soft-tissue]
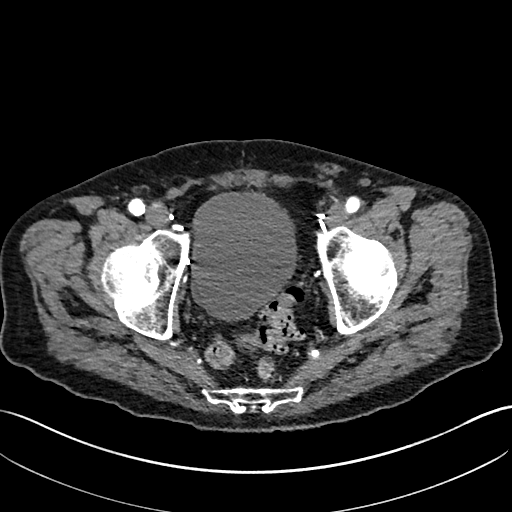
[im 57/169  soft-tissue]
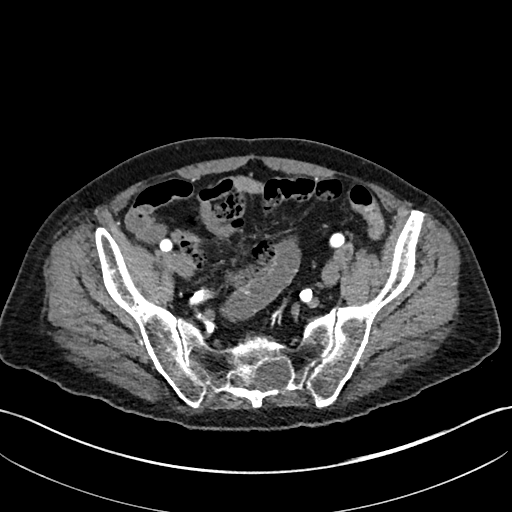
[im 73/169  soft-tissue]
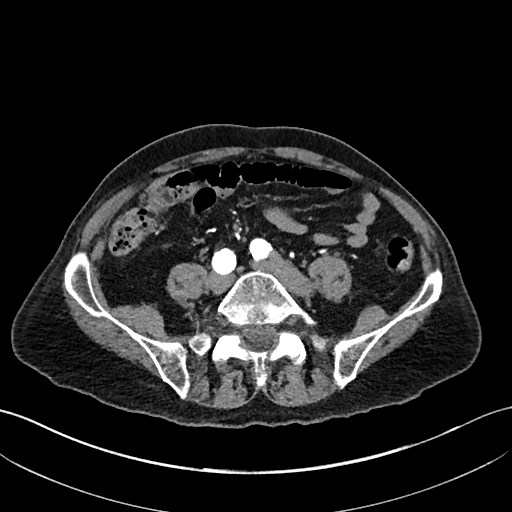
[im 89/169  soft-tissue]
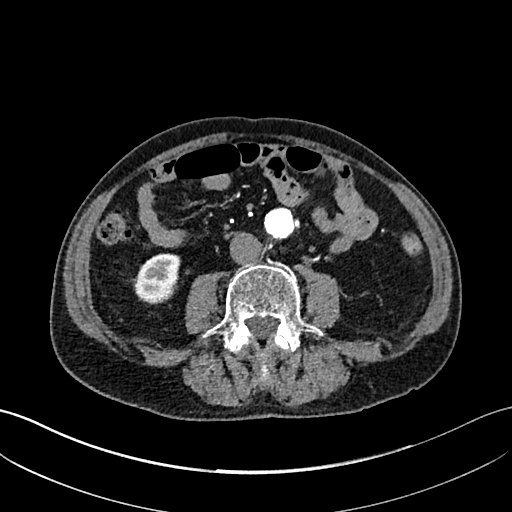
[im 97/169  soft-tissue]
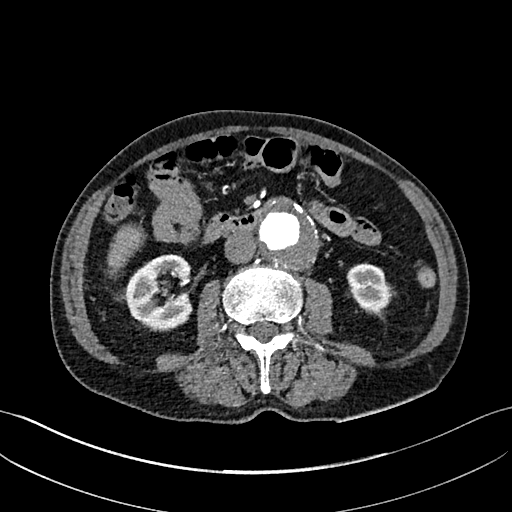
[im 113/169  soft-tissue]
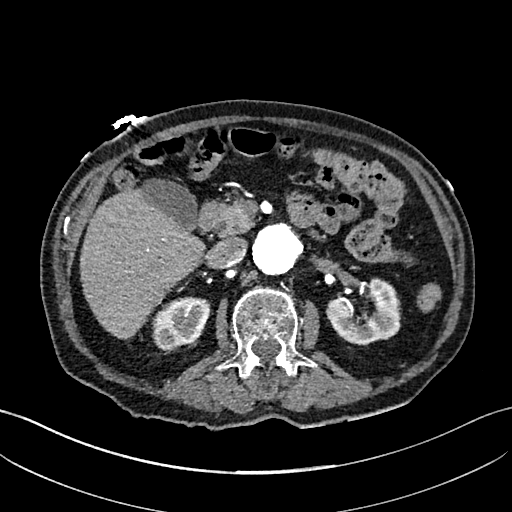
[im 129/169  soft-tissue]
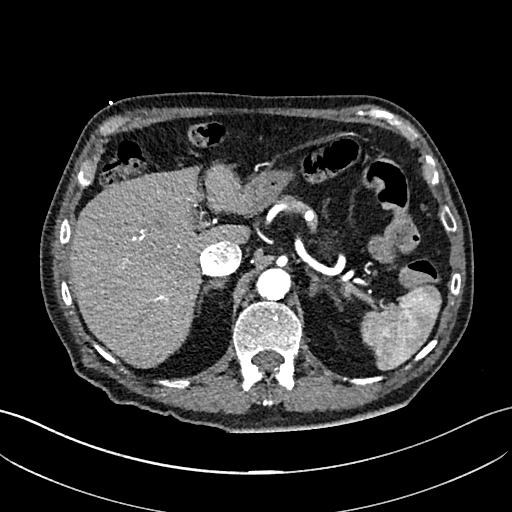
[im 129/169  bone]
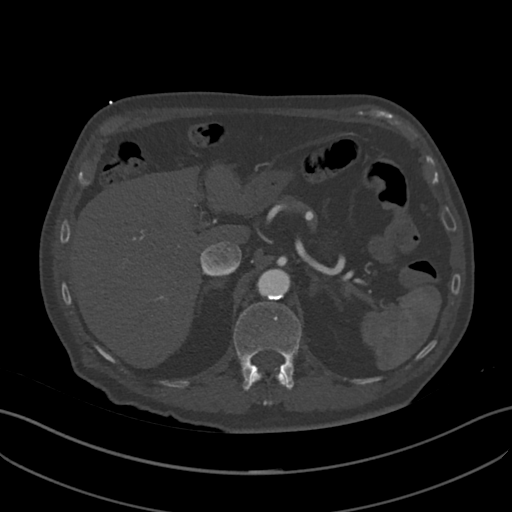
[im 145/169  soft-tissue]
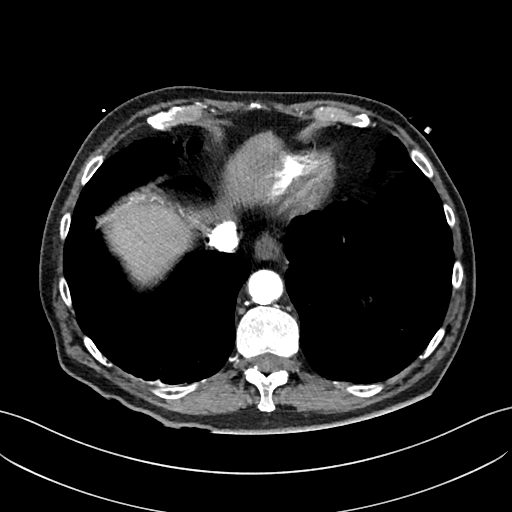
[im 161/169  soft-tissue]
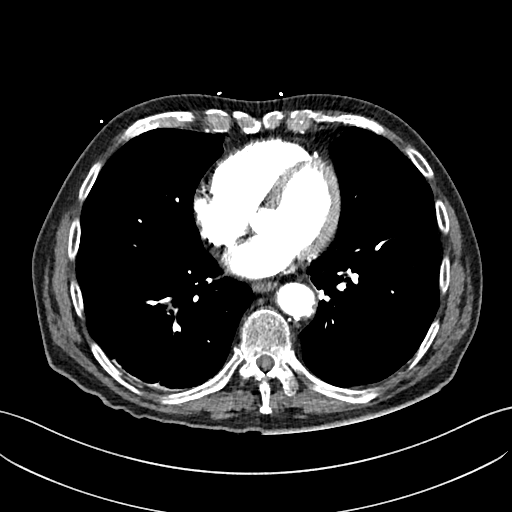

[Series 8: coronals · coronal · 0.74mm/px · 3 of 125 slices shown]
[im 32/125  soft-tissue]
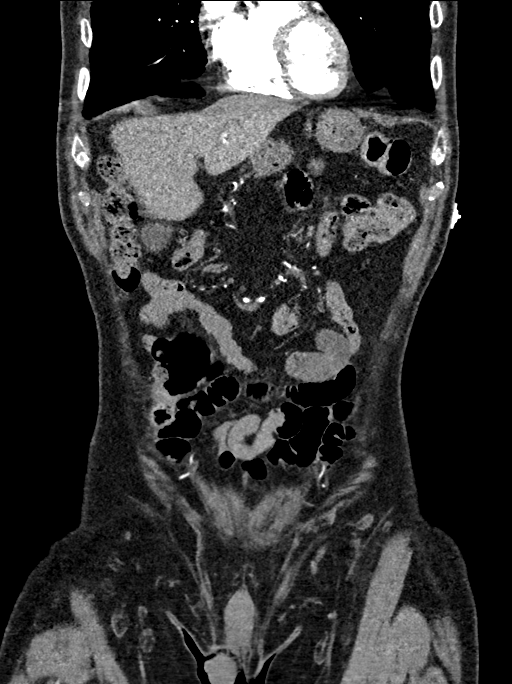
[im 63/125  soft-tissue]
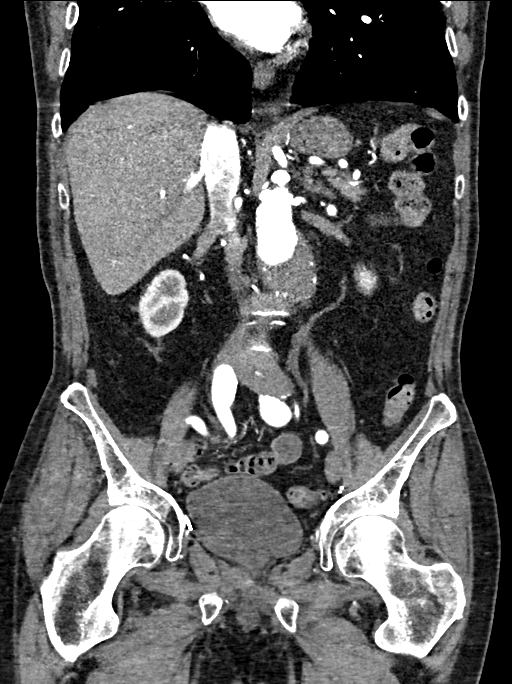
[im 94/125  soft-tissue]
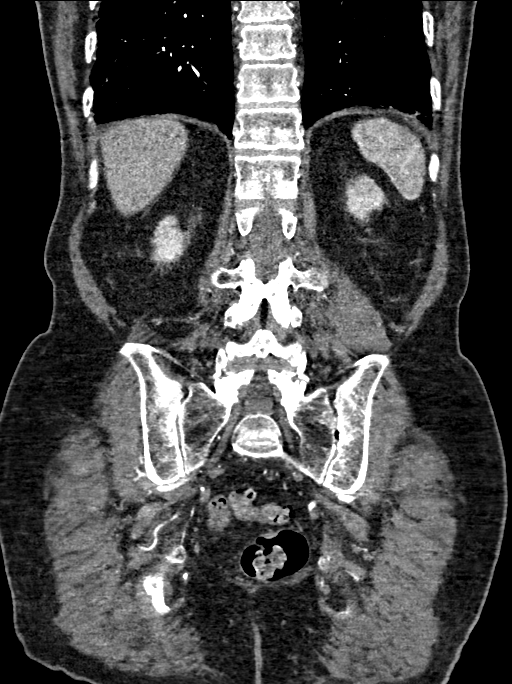

[14 of 46 positions shown; findings below may reference images not displayed]

FINDINGS: VASCULAR

Coronary calcifications.

Aorta: Moderate calcified atheromatous plaque in the visualized
distal descending thoracic and suprarenal segments. Fusiform
infrarenal aneurysm measuring 5.7 x 4.6 cm maximum transverse
dimensions, previously 5.1 x 4.2 by my measurement. Aorta tapers to
a diameter of 2.2 cm above its bifurcation. There is nonocclusive
mural thrombus in the aneurysmal segment. No dissection or stenosis.

Celiac: Calcified ostial plaque without high-grade stenosis, patent
distally

SMA: Calcified ostial plaque resulting in short segment stenosis of
possible hemodynamic significance, patent distally with classic
distal branch anatomy.

Renals: Single left, with calcified ostial plaque resulting in short
segment stenosis of at least mild severity, patent distally.

Duplicated on the right. Superior is diminutive, supplying a portion
of the upper pole. Inferior is dominant, with partially calcified
ostial plaque resulting in no high-grade stenosis, patent distally.

IMA: Patent, arising below the aneurysmal segment.

Inflow: Fusiform dilatation of bilateral common iliac arteries
measured up to a 2 cm diameter right, 18 mm left. External and
internal iliac arteries Are ectatic. Mild tortuosity of the iliac
arterial systems. No dissection or stenosis.

Proximal Outflow: Mild nonocclusive calcified plaque.

Veins: Dedicated venous phase imaging not obtained.

Review of the MIP images confirms the above findings.

NON-VASCULAR

Lower chest: Emphysematous changes in the lung bases. No pleural or
pericardial effusion.

Hepatobiliary: Soft tissue attenuation in the inferior aspect of the
nondilated gallbladder may represent noncalcified stone versus
polyp. No liver lesion. No biliary ductal dilatation.

Pancreas: Unremarkable. No pancreatic ductal dilatation or
surrounding inflammatory changes.

Spleen: Normal in size without focal abnormality.

Adrenals/Urinary Tract: Adrenal glands are unremarkable. Kidneys are
normal, without renal calculi, focal lesion, or hydronephrosis.
Bladder is unremarkable.

Stomach/Bowel: Stomach is within normal limits. Appendix not
identified. Scattered diverticula from the distal descending and
sigmoid portions of the colon. No evidence of bowel wall thickening,
distention, or inflammatory changes.

Lymphatic:  No enlarged abdominal or pelvic lymph nodes.

Reproductive: Previous prostatectomy. Multiple surgical clips along
the pelvic sidewalls.

Other: No ascites.  No free air.

Musculoskeletal: Prominent left Tarlov cyst in the sacrum. No
fracture or other worrisome bone lesion.
IMPRESSION: VASCULAR

1. Increase in infrarenal aortic aneurysm to 5.7 cm diameter
(previously 5.1 cm 03/04/2014).
2. Fusiform aneurysmal dilatation of common iliac arteries, 2 cm
right, 1.8 cm left

NON-VASCULAR

1. No acute findings.
2. Pulmonary emphysema
3. Possible cholelithiasis.
4. Descending and sigmoid diverticulosis.
